# Patient Record
Sex: Female | Born: 1965 | Race: Black or African American | Hispanic: No | Marital: Married | Smoking: Never smoker
Health system: Southern US, Community
[De-identification: ages and names within clinical notes are randomized; demographics above are authoritative.]

## PROBLEM LIST (undated history)

## (undated) DIAGNOSIS — H409 Unspecified glaucoma: Secondary | ICD-10-CM

## (undated) DIAGNOSIS — F419 Anxiety disorder, unspecified: Secondary | ICD-10-CM

## (undated) DIAGNOSIS — R569 Unspecified convulsions: Secondary | ICD-10-CM

## (undated) DIAGNOSIS — F32A Depression, unspecified: Secondary | ICD-10-CM

## (undated) DIAGNOSIS — K219 Gastro-esophageal reflux disease without esophagitis: Secondary | ICD-10-CM

## (undated) DIAGNOSIS — F329 Major depressive disorder, single episode, unspecified: Secondary | ICD-10-CM

## (undated) DIAGNOSIS — M1990 Unspecified osteoarthritis, unspecified site: Secondary | ICD-10-CM

## (undated) DIAGNOSIS — Z1509 Genetic susceptibility to other malignant neoplasm: Secondary | ICD-10-CM

## (undated) HISTORY — PX: WISDOM TOOTH EXTRACTION: SHX21

## (undated) HISTORY — PX: JOINT REPLACEMENT: SHX530

## (undated) HISTORY — PX: ABDOMINAL HYSTERECTOMY: SHX81

## (undated) HISTORY — PX: TUBAL LIGATION: SHX77

## (undated) HISTORY — PX: OTHER SURGICAL HISTORY: SHX169

## (undated) HISTORY — DX: Anxiety disorder, unspecified: F41.9

## (undated) HISTORY — DX: Unspecified glaucoma: H40.9

## (undated) HISTORY — PX: CO2 LASER APPLICATION: SHX5778

---

## 1997-08-19 ENCOUNTER — Inpatient Hospital Stay (HOSPITAL_COMMUNITY): Admission: AD | Admit: 1997-08-19 | Discharge: 1997-08-19 | Payer: Self-pay | Admitting: Obstetrics

## 1997-08-27 ENCOUNTER — Inpatient Hospital Stay (HOSPITAL_COMMUNITY): Admission: AD | Admit: 1997-08-27 | Discharge: 1997-08-27 | Payer: Self-pay | Admitting: *Deleted

## 1997-10-16 ENCOUNTER — Other Ambulatory Visit: Admission: RE | Admit: 1997-10-16 | Discharge: 1997-10-16 | Payer: Self-pay | Admitting: *Deleted

## 1997-10-30 ENCOUNTER — Ambulatory Visit (HOSPITAL_COMMUNITY): Admission: RE | Admit: 1997-10-30 | Discharge: 1997-10-30 | Payer: Self-pay | Admitting: Obstetrics & Gynecology

## 1997-12-24 ENCOUNTER — Inpatient Hospital Stay (HOSPITAL_COMMUNITY): Admission: AD | Admit: 1997-12-24 | Discharge: 1997-12-26 | Payer: Self-pay | Admitting: *Deleted

## 1998-09-14 ENCOUNTER — Encounter: Payer: Self-pay | Admitting: Emergency Medicine

## 1998-09-14 ENCOUNTER — Inpatient Hospital Stay (HOSPITAL_COMMUNITY): Admission: EM | Admit: 1998-09-14 | Discharge: 1998-09-16 | Payer: Self-pay | Admitting: Emergency Medicine

## 1999-04-28 ENCOUNTER — Encounter: Admission: RE | Admit: 1999-04-28 | Discharge: 1999-05-25 | Payer: Self-pay

## 1999-07-06 ENCOUNTER — Emergency Department (HOSPITAL_COMMUNITY): Admission: EM | Admit: 1999-07-06 | Discharge: 1999-07-06 | Payer: Self-pay | Admitting: Emergency Medicine

## 1999-07-10 ENCOUNTER — Emergency Department (HOSPITAL_COMMUNITY): Admission: EM | Admit: 1999-07-10 | Discharge: 1999-07-10 | Payer: Self-pay | Admitting: Emergency Medicine

## 1999-07-10 ENCOUNTER — Encounter: Payer: Self-pay | Admitting: Emergency Medicine

## 1999-07-20 HISTORY — PX: ANKLE SURGERY: SHX546

## 1999-11-19 ENCOUNTER — Inpatient Hospital Stay (HOSPITAL_COMMUNITY): Admission: EM | Admit: 1999-11-19 | Discharge: 1999-11-19 | Payer: Self-pay | Admitting: Obstetrics & Gynecology

## 1999-12-08 ENCOUNTER — Encounter: Admission: RE | Admit: 1999-12-08 | Discharge: 1999-12-08 | Payer: Self-pay | Admitting: Sports Medicine

## 2000-01-14 ENCOUNTER — Other Ambulatory Visit: Admission: RE | Admit: 2000-01-14 | Discharge: 2000-01-14 | Payer: Self-pay | Admitting: Obstetrics and Gynecology

## 2000-04-28 ENCOUNTER — Inpatient Hospital Stay (HOSPITAL_COMMUNITY): Admission: AD | Admit: 2000-04-28 | Discharge: 2000-04-28 | Payer: Self-pay | Admitting: *Deleted

## 2000-07-13 ENCOUNTER — Inpatient Hospital Stay (HOSPITAL_COMMUNITY): Admission: AD | Admit: 2000-07-13 | Discharge: 2000-07-15 | Payer: Self-pay | Admitting: Obstetrics and Gynecology

## 2000-08-30 ENCOUNTER — Ambulatory Visit (HOSPITAL_COMMUNITY): Admission: RE | Admit: 2000-08-30 | Discharge: 2000-08-30 | Payer: Self-pay | Admitting: Obstetrics and Gynecology

## 2002-07-09 ENCOUNTER — Ambulatory Visit (HOSPITAL_COMMUNITY): Admission: RE | Admit: 2002-07-09 | Discharge: 2002-07-09 | Payer: Self-pay | Admitting: *Deleted

## 2002-09-25 ENCOUNTER — Emergency Department (HOSPITAL_COMMUNITY): Admission: EM | Admit: 2002-09-25 | Discharge: 2002-09-25 | Payer: Self-pay | Admitting: Emergency Medicine

## 2002-12-31 ENCOUNTER — Inpatient Hospital Stay (HOSPITAL_COMMUNITY): Admission: EM | Admit: 2002-12-31 | Discharge: 2003-01-08 | Payer: Self-pay | Admitting: Psychiatry

## 2003-01-27 ENCOUNTER — Emergency Department (HOSPITAL_COMMUNITY): Admission: EM | Admit: 2003-01-27 | Discharge: 2003-01-27 | Payer: Self-pay | Admitting: Emergency Medicine

## 2003-02-07 ENCOUNTER — Encounter: Admission: RE | Admit: 2003-02-07 | Discharge: 2003-02-07 | Payer: Self-pay | Admitting: Internal Medicine

## 2003-03-29 ENCOUNTER — Inpatient Hospital Stay (HOSPITAL_COMMUNITY): Admission: EM | Admit: 2003-03-29 | Discharge: 2003-03-31 | Payer: Self-pay | Admitting: Psychiatry

## 2004-07-01 ENCOUNTER — Ambulatory Visit (HOSPITAL_COMMUNITY): Admission: RE | Admit: 2004-07-01 | Discharge: 2004-07-01 | Payer: Self-pay | Admitting: *Deleted

## 2004-07-01 ENCOUNTER — Encounter (INDEPENDENT_AMBULATORY_CARE_PROVIDER_SITE_OTHER): Payer: Self-pay | Admitting: Specialist

## 2004-10-13 ENCOUNTER — Emergency Department (HOSPITAL_COMMUNITY): Admission: EM | Admit: 2004-10-13 | Discharge: 2004-10-13 | Payer: Self-pay | Admitting: Family Medicine

## 2005-08-16 ENCOUNTER — Other Ambulatory Visit: Admission: RE | Admit: 2005-08-16 | Discharge: 2005-08-16 | Payer: Self-pay | Admitting: Obstetrics and Gynecology

## 2005-09-01 ENCOUNTER — Ambulatory Visit: Payer: Self-pay | Admitting: Internal Medicine

## 2006-01-08 ENCOUNTER — Emergency Department (HOSPITAL_COMMUNITY): Admission: EM | Admit: 2006-01-08 | Discharge: 2006-01-08 | Payer: Self-pay | Admitting: Emergency Medicine

## 2006-06-02 ENCOUNTER — Emergency Department (HOSPITAL_COMMUNITY): Admission: EM | Admit: 2006-06-02 | Discharge: 2006-06-02 | Payer: Self-pay | Admitting: Family Medicine

## 2006-12-14 ENCOUNTER — Emergency Department (HOSPITAL_COMMUNITY): Admission: EM | Admit: 2006-12-14 | Discharge: 2006-12-14 | Payer: Self-pay | Admitting: Emergency Medicine

## 2006-12-16 ENCOUNTER — Emergency Department (HOSPITAL_COMMUNITY): Admission: EM | Admit: 2006-12-16 | Discharge: 2006-12-16 | Payer: Self-pay | Admitting: Family Medicine

## 2007-01-14 ENCOUNTER — Emergency Department (HOSPITAL_COMMUNITY): Admission: EM | Admit: 2007-01-14 | Discharge: 2007-01-14 | Payer: Self-pay | Admitting: Family Medicine

## 2007-03-17 ENCOUNTER — Ambulatory Visit: Payer: Self-pay | Admitting: Psychiatry

## 2007-03-17 ENCOUNTER — Other Ambulatory Visit: Payer: Self-pay | Admitting: *Deleted

## 2007-03-17 ENCOUNTER — Other Ambulatory Visit: Payer: Self-pay | Admitting: Emergency Medicine

## 2007-03-17 ENCOUNTER — Inpatient Hospital Stay (HOSPITAL_COMMUNITY): Admission: AD | Admit: 2007-03-17 | Discharge: 2007-03-20 | Payer: Self-pay | Admitting: Psychiatry

## 2007-05-03 ENCOUNTER — Emergency Department (HOSPITAL_COMMUNITY): Admission: EM | Admit: 2007-05-03 | Discharge: 2007-05-03 | Payer: Self-pay | Admitting: Family Medicine

## 2007-05-04 ENCOUNTER — Emergency Department (HOSPITAL_COMMUNITY): Admission: EM | Admit: 2007-05-04 | Discharge: 2007-05-04 | Payer: Self-pay | Admitting: Emergency Medicine

## 2007-08-11 ENCOUNTER — Emergency Department (HOSPITAL_COMMUNITY): Admission: EM | Admit: 2007-08-11 | Discharge: 2007-08-12 | Payer: Self-pay | Admitting: Emergency Medicine

## 2007-11-29 ENCOUNTER — Emergency Department (HOSPITAL_COMMUNITY): Admission: EM | Admit: 2007-11-29 | Discharge: 2007-11-29 | Payer: Self-pay | Admitting: Emergency Medicine

## 2008-02-13 ENCOUNTER — Encounter: Admission: RE | Admit: 2008-02-13 | Discharge: 2008-02-13 | Payer: Self-pay | Admitting: Obstetrics and Gynecology

## 2008-03-27 ENCOUNTER — Inpatient Hospital Stay (HOSPITAL_COMMUNITY): Admission: EM | Admit: 2008-03-27 | Discharge: 2008-04-01 | Payer: Self-pay | Admitting: Emergency Medicine

## 2008-09-07 ENCOUNTER — Inpatient Hospital Stay (HOSPITAL_COMMUNITY): Admission: EM | Admit: 2008-09-07 | Discharge: 2008-09-11 | Payer: Self-pay | Admitting: Emergency Medicine

## 2009-12-12 ENCOUNTER — Encounter: Admission: RE | Admit: 2009-12-12 | Discharge: 2009-12-12 | Payer: Self-pay | Admitting: Internal Medicine

## 2010-08-09 ENCOUNTER — Encounter: Payer: Self-pay | Admitting: Obstetrics and Gynecology

## 2010-11-03 LAB — CBC
HCT: 36.4 % (ref 36.0–46.0)
HCT: 42.9 % (ref 36.0–46.0)
Hemoglobin: 12.2 g/dL (ref 12.0–15.0)
Hemoglobin: 12.5 g/dL (ref 12.0–15.0)
MCHC: 32.7 g/dL (ref 30.0–36.0)
MCHC: 34 g/dL (ref 30.0–36.0)
MCV: 92.5 fL (ref 78.0–100.0)
MCV: 93.1 fL (ref 78.0–100.0)
Platelets: 122 10*3/uL — ABNORMAL LOW (ref 150–400)
Platelets: 134 10*3/uL — ABNORMAL LOW (ref 150–400)
RBC: 3.51 MIL/uL — ABNORMAL LOW (ref 3.87–5.11)
RBC: 3.9 MIL/uL (ref 3.87–5.11)
RDW: 14.8 % (ref 11.5–15.5)
RDW: 16 % — ABNORMAL HIGH (ref 11.5–15.5)
WBC: 3.8 10*3/uL — ABNORMAL LOW (ref 4.0–10.5)
WBC: 4.5 10*3/uL (ref 4.0–10.5)

## 2010-11-03 LAB — URINE MICROSCOPIC-ADD ON

## 2010-11-03 LAB — COMPREHENSIVE METABOLIC PANEL
ALT: 138 U/L — ABNORMAL HIGH (ref 0–35)
ALT: 220 U/L — ABNORMAL HIGH (ref 0–35)
AST: 1897 U/L — ABNORMAL HIGH (ref 0–37)
AST: 307 U/L — ABNORMAL HIGH (ref 0–37)
Alkaline Phosphatase: 272 U/L — ABNORMAL HIGH (ref 39–117)
Alkaline Phosphatase: 282 U/L — ABNORMAL HIGH (ref 39–117)
BUN: <1 mg/dL — ABNORMAL LOW (ref 6–23)
CO2: 26 mEq/L (ref 19–32)
CO2: 29 mEq/L (ref 19–32)
Chloride: 101 mEq/L (ref 96–112)
Chloride: 95 mEq/L — ABNORMAL LOW (ref 96–112)
Chloride: 99 mEq/L (ref 96–112)
Creatinine, Ser: 0.67 mg/dL (ref 0.4–1.2)
Creatinine, Ser: 0.94 mg/dL (ref 0.4–1.2)
GFR calc Af Amer: >60 mL/min (ref >60)
GFR calc Af Amer: >60 mL/min (ref >60)
GFR calc non Af Amer: >60 mL/min (ref >60)
GFR calc non Af Amer: >60 mL/min (ref >60)
Glucose, Bld: 91 mg/dL (ref 70–99)
Potassium: 3.1 mEq/L — ABNORMAL LOW (ref 3.5–5.1)
Potassium: 4.3 mEq/L (ref 3.5–5.1)
Total Bilirubin: 5.1 mg/dL — ABNORMAL HIGH (ref 0.3–1.2)
Total Bilirubin: 6.1 mg/dL — ABNORMAL HIGH (ref 0.3–1.2)
Total Bilirubin: 7.8 mg/dL — ABNORMAL HIGH (ref 0.3–1.2)

## 2010-11-03 LAB — APTT: aPTT: 29 seconds (ref 24–37)

## 2010-11-03 LAB — URINALYSIS, ROUTINE W REFLEX MICROSCOPIC
Glucose, UA: 100 mg/dL — AB
Ketones, ur: 15 mg/dL — AB
Protein, ur: 100 mg/dL — AB

## 2010-11-03 LAB — DIFFERENTIAL
Band Neutrophils: 0 % (ref 0–10)
Blasts: 0 %
Lymphocytes Relative: 31 % (ref 12–46)
Metamyelocytes Relative: 0 %
Myelocytes: 0 %
Promyelocytes Absolute: 0 %

## 2010-11-03 LAB — HEPATITIS C ANTIBODY: HCV Ab: NEGATIVE

## 2010-11-03 LAB — RAPID URINE DRUG SCREEN, HOSP PERFORMED
Barbiturates: NOT DETECTED
Benzodiazepines: POSITIVE — AB
Cocaine: NOT DETECTED

## 2010-11-03 LAB — HEPATIC FUNCTION PANEL
AST: 296 U/L — ABNORMAL HIGH (ref 0–37)
Albumin: 2.6 g/dL — ABNORMAL LOW (ref 3.5–5.2)
Total Protein: 6.3 g/dL (ref 6.0–8.3)

## 2010-11-03 LAB — PROTIME-INR: INR: 1 (ref 0.00–1.49)

## 2010-12-01 NOTE — Consult Note (Signed)
NAMEJAMIL, CASTILLO NO.:  [id number]   MEDICAL RECORD NO.:  [id number]          PATIENT TYPE:  INP   LOCATION:  3015                         FACILITY:  [facility]   PHYSICIAN:  Bernette Redbird, M.D.   DATE OF BIRTH:  08/08/1965   DATE OF CONSULTATION:  03/27/2008  DATE OF DISCHARGE:                                 CONSULTATION   ADDENDUM   Family history of this patient discloses that her brother developed  colon cancer around age 45, requiring chemotherapy.   The patient herself will need screening for colon cancer once she is  over this acute illness.           ______________________________  Bernette Redbird, M.D.     RB/MEDQ  D:  03/27/2008  T:  03/28/2008  Job:  [id number]   cc:   Fleet Contras, M.D.

## 2010-12-01 NOTE — Discharge Summary (Signed)
[name], [name]:  [id number]   MEDICAL RECORD NO.:  [id number]          PATIENT TYPE:  IPS   LOCATION:  0507                          FACILITY:  [facility]   PHYSICIAN:  Anselm Jungling, MD  DATE OF BIRTH:  09-Feb-1966   DATE OF ADMISSION:  03/17/2007  DATE OF DISCHARGE:  03/20/2007                               DISCHARGE SUMMARY   IDENTIFYING DATA/REASON FOR ADMISSION:  The patient is a 45 year old  widowed white female who presented requesting help with alcohol  detoxification and depression.  She had lost her husband last October.  She had been drinking a case of beer per day.  Please refer to the  admission note for further details pertaining to the symptoms,  circumstances and history that led to her hospitalization.   INITIAL DIAGNOSTIC IMPRESSION:  She was given initial AXIS I diagnoses  of alcohol dependence, depressive disorder not otherwise specified,  substance-induced mood disorder, and bereavement.   MEDICAL/LABORATORY:  The patient was in good health without any active  or chronic medical problems.  She was given Protonix 40 mg daily for  symptoms of GERD.   HOSPITAL COURSE:  The patient was admitted to the adult inpatient  psychiatric service.  She presented as a well-nourished, well-developed  woman who was pleasant and cooperative.  There were no signs or symptoms  of psychosis or thought disorder.  She denied suicidal ideation, but was  quite depressed.  She verbalized a strong desire for help.   She was involved in the therapeutic milieu including 12-step groups.  She was ordered Librium on a p.r.n. basis, but had no withdrawal  symptoms and really did not require any.  In review of her depressive  symptoms, she did appear to be a candidate for antidepressant medication  therapy and was started on a trial of Lexapro, which was well-tolerated.  On the fourth hospital day, she appeared to be beyond the point of  having any further  possibility of alcohol withdrawal symptoms.  She  agreed to the following aftercare plan.   AFTERCARE:  The patient was to present for a walk-in assessment at the  [facility] in [place] on the day following discharge.   DISCHARGE MEDICATIONS:  1. Zoloft 50 mg daily, which was initiated instead of Lexapro as she      had had a good family response to Zoloft for depression in the      past.  2. Campral 666 mg t.i.d. for alcohol cessation.  3. Protonix 40 mg daily.   DISCHARGE DIAGNOSES:  AXIS I:  Alcohol dependence, early remission.  Substance-induced mood disorder.  Depressive disorder not otherwise  specified.  Bereavement.  AXIS II:  Deferred.  AXIS III:  No acute or chronic illnesses.  AXIS IV:  Stressors:  Severe.  AXIS V:  GAF on discharge 55.      Anselm Jungling, MD  Electronically Signed     SPB/MEDQ  D:  03/22/2007  T:  03/22/2007  Job:  [id number]

## 2010-12-01 NOTE — H&P (Signed)
[name], [name]                 ACCOUNT NO.:  [id number]   MEDICAL RECORD NO.:  [id number]          PATIENT TYPE:  INP   LOCATION:  3015                         FACILITY:  [facility]   PHYSICIAN:  Fleet Contras, M.D.    DATE OF BIRTH:  1966-04-12   DATE OF ADMISSION:  03/27/2008  DATE OF DISCHARGE:                              HISTORY & PHYSICAL   PRESENTING COMPLAINTS:  Yellowing of eyes, weakness, and weight loss.   HISTORY OF PRESENT ILLNESS:  [name] is a 45 year old African American  lady with past medical problems significant for depression and  gastroesophageal reflux disease.  She presented to the emergency room at  [facility] with 63-month history of progressive symptoms of  weight loss in spite of good appetite and then in last few weeks, she  has yellowing of the eyes and her palms.  She stated that her husband  died about 8 months ago and she has been having some binge drinking and  has been using some Tylenol and ibuprofen for abdominal pain on and off.  She stated that her stool color varies from brown to green and pale.  She denies any nausea, vomiting, or diarrhea, but has been constipated  on occasion.  She has not had any recent blood transfusions or tattoos.  She admits to drinking several beers on a daily basis and consumes a  whole crate of beer on a weekend.  She denies using any tobacco or  illicit drugs.  She has not had any fevers or chills.  She states her  appetite is relatively good, but on occasion when she is very depressed,  she does not eat much.  She has had no headaches, blurring of vision,  weakness of extremities, or slurring of speech.  She had no chest pain,  shortness of breath, orthopnea, PND, or palpitations.  She denies any  urinary symptoms.  She had no joint pains or joint swellings.  At  emergency room, initial laboratory data showed a markedly elevated liver  function tests and ultrasound scan and CT scan confirmed enlarged  gallbladder without stones, but no evidence of acute cholecystitis.  She  has not been evaluated by surgery and determined not to be a surgical  candidate and she is being admitted under medical service for further  evaluation and appropriate management.   PAST MEDICAL HISTORY:  1. Depression.  She has been treated on and off over the last few      years.  She was last treated in 2007, with Lexapro and alprazolam.  2. Gastroesophageal reflux disease.  She was on Zantac 150 mg b.i.d.   ALLERGIES:  She denies any drug allergies.   FAMILY AND SOCIAL HISTORY:  She is widowed and currently lives with her  children 101 and 55 year old.  Alcohol use as above.  She denies any use  of tobacco or illicit drugs.   REVIEW OF SYSTEMS:  Essentially as above.   PHYSICAL EXAMINATION:  GENERAL:  She is lying in the hospital bed, not  in acute respiratory or painful distress.  She is not  pale.  She is  moderately icteric.  She is not cyanosed.  She is slightly dehydrated.  INITIAL VITAL SIGNS:  Blood pressure of 158/98; heart rate of 115,  regular; respiratory rate of 16; temperature 98.8; and O2 sats on room  air is 98%.  NECK:  Supple with no elevated JVD.  No cervical lymphadenopathy.  No  carotid bruit.  CHEST:  Good air entry bilaterally.  No rales, rhonchi, or wheezes.  HEART:  S1 and S2 are heard with no murmurs, no S3 gallops.  ABDOMEN:  Full, soft, nontender, and no masses.  Bowel sounds are  present.  EXTREMITIES:  Shows no edema.  No calf tenderness or swelling.  The skin  shows no rashes or evidence of chronic liver disease.  CNS: She is alert and oriented x3 with no focal neurological deficits.   INITIAL LABORATORY DATA:  Sodium is 133, potassium 3.6, chloride 99,  bicarbonate of 26, glucose is 112, BUN is 2, creatinine 0.73, total  bilirubin is 4.2, alkaline phosphatase 348, AST 1046, ALT 245, total  bilirubin 4.2, total protein 7.1, albumin 3.4, calcium was 9.5, lipase  is 28,  pregnancy test by urine is negative.  CBC shows white count of  4.7, hemoglobin 12.3, hematocrit 37.6, and platelet count of 135.  Urinalysis essentially negative urine.  Microscopy shows 3-6 wbc by high-  power field and rare bacteria.  Amylase is 33.  Tylenol levels is less  than 10.  PT is 12.7, INR was 0.9, and APTT is 25.  Chest x-ray is  reported as showing no acute chest disease.  Ultrasound scan of the  abdomen shows increased echo texture throughout the liver compatible  with fatty infiltration, as gallbladder had distention, sludge, and wall  thickening, no definite stones.  A CT scan of the abdomen and pelvis  essentially confirmed the findings on ultrasound scan.   ASSESSMENT:  [name] is a 45 year old African American lady presented  to the emergency room with about 20 pounds of weight loss over the last  3 months associated with progressive worsening of yellow in eyes and  hands.  She admits to using alcohol and Tylenol in a excessive amounts  recently.  She is being admitted to the hospital for further evaluation.   ADMISSION DIAGNOSES:  1. Obstructive jaundice.  2. Distended gallbladder.  She has been evaluated by surgery and      consider not to be a surgical candidate.  A HIDA scan is being      requested.  3. Chronic alcohol abuse.   PLAN OF CARE:  She will be admitted to a medical bed.  She will be on  clear liquids.  Vital signs q.4 h. activity as tolerated.  She will be  on IV fluids with D5 normal saline at 75 mL an hour, multivitamins,  folic acid, and thiamine will be commenced.  She did have oxycodone 5 mg  1-2 pills q.6 h. p.r.n. for pain.   Further laboratory data including ANA, sed rate, 2 more markers, ammonia  levels, and acute hepatitis panel will be requested.  A gastroenterology  consult will also be requested.  This plan of care has been discussed  with the patient and the treatment plan will depend on her response to  appropriate therapy and  results of further investigation.      Fleet Contras, M.D.  Electronically Signed     EA/MEDQ  D:  03/27/2008  T:  03/28/2008  Job:  [id number]

## 2010-12-01 NOTE — Op Note (Signed)
NAMEMALLISA, ALAMEDA NO.:  [id number]   MEDICAL RECORD NO.:  [id number]          PATIENT TYPE:  INP   LOCATION:  3015                         FACILITY:  [facility]   PHYSICIAN:  Danise Edge, M.D.   DATE OF BIRTH:  12/30/1965   DATE OF PROCEDURE:  04/01/2008  DATE OF DISCHARGE:  04/01/2008                               OPERATIVE REPORT   REFERRING PHYSICIAN:  Bernette Redbird, MD   PROCEDURE INDICATIONS:  Ms. Tiaria Biby is a 45 year old female born  1966-02-03.  Ms. Taplin's brother died of colon cancer in his  late 22s.  I think Ms. Spielmann last underwent a screening colonoscopy by  Dr. Roosvelt Harps in 2005.  I do not have the operative report.   ENDOSCOPY:  Danise Edge, MD   PREMEDICATIONS:  Versed 12.5 mg and fentanyl 135 mcg.   PROCEDURE:  After obtaining informed consent, Ms. Vinsant was placed in  the left lateral decubitus position.  I administered intravenous  fentanyl and intravenous Versed to achieve conscious sedation for the  procedure.  The patient's blood pressure, oxygen saturation and cardiac  rhythm were monitored throughout the procedure and documented in the  medical record.   Anal inspection and digital rectal exam were normal.  The Pentax  pediatric colonoscope was introduced into the rectum and advanced to the  cecum.  A normal-appearing ileocecal valve and appendiceal orifice were  identified.  Colonic preparation for the exam today was good.   Rectum normal.  Sigmoid colon and descending colon normal.  Splenic flexure normal.  Transverse colon normal.  Hepatic flexure normal.  Ascending colon normal.  Cecum and ileocecal valve normal.   ASSESSMENT:  Normal proctocolonoscopy to the cecum.  No endoscopic  evidence for the presence of colorectal neoplasia.   RECOMMENDATIONS:  Repeat screening colonoscopy in approximately 5 years.          ______________________________  Danise Edge, M.D.    MJ/MEDQ  D:  04/01/2008   T:  04/02/2008  Job:  [id number]   cc:   Bernette Redbird, M.D.
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
## 2010-12-01 NOTE — Consult Note (Signed)
[name], [name] NO.:  [id number]   MEDICAL RECORD NO.:  [id number]          PATIENT TYPE:  INP   LOCATION:  3015                         FACILITY:  [facility]   PHYSICIAN:  Bernette Redbird, M.D.   DATE OF BIRTH:  01-04-1966   DATE OF CONSULTATION:  03/27/2008  DATE OF DISCHARGE:                                 CONSULTATION   We are asked to see [name] today in consultation for obstructive  jaundice by Dr. Basilio Cairo.   HISTORY OF PRESENT ILLNESS:  This is an 45 year old female with a  history of alcohol abuse who reports abdominal pain, increasing over the  last 3 weeks.  She also reports anorexia with a 15-pound weight loss,  confusion, disorientation, multiple falls, and several episodes of  vomiting fluid which she describes as bilious.  She also reports long-  term dysphagia to solids and with occasional regurgitation, she also has  a sensation of globus.  She says, she has had these for years as a  matter of fact, she had an upper endoscopy by Dr. Roosvelt Harps for  this in 2005, which was normal.  She tells me that her scleral icterus  started approximately 3 days ago.  She did have heavy alcohol use over  the holiday weekend; although, she cannot quantify it for me.  She tells  me, she takes approximately 6 Tylenol a day and multiple ibuprofen  daily, all this for her abdominal and extremities pain.   PAST MEDICAL HISTORY:  Significant for depression.  She has had a  Behavioral Health admission.  She has seizure disorder.  She has a  history of alcohol abuse.  She has had a bilateral tubal ligation.   CURRENT MEDICATIONS:  Include, approximately 6 Tylenol daily, multiple  ibuprofen daily, samples of antidepressants that were given to her by a  doctor and an over-the-counter sleeping aid.   She has no known drug allergies.   REVIEW OF SYSTEMS:  Significant for recent fever, anorexia and weight  loss.   SOCIAL HISTORY:  Positive for alcohol.  She  cannot quantify the amount.  She denies tobacco use.   PHYSICAL EXAMINATION:  GENERAL:  She is alert and appropriate.  She  appears somewhat jittery.  VITAL SIGNS:  Her temperature is 97, pulse 78, respirations 20, and  blood pressure is 146/74.  HEENT:  She does have 2+ scleral icterus.  HEART:  Regular rate and rhythm.  LUNGS:  Clear to auscultation.  ABDOMEN:  Soft with no obvious ascites, mildly tender in the epigastrium  and the left upper quadrant.  She has no organomegaly noted.   LABORATORY DATA:  Significant for her LFTs which show an AST of 1046,  ALT 245, alk phos 348, and total bilirubin 4.2.  Her BMET is within  normal limits, other than a sodium of 133.  Her CBC is within normal  limits.  Amylase and lipase are within normal limits.  Coreg,  acetaminophen level, and ammonia level are all pending.  A CT of her  abdomen done today showed:  1. Fatty liver.  2. Mild hepatomegaly  3. Gallbladder wall thickening and distention of gallbladder.  4. Normal pancreas.  5. No bowel obstruction.  She also had an ultrasound of her abdomen done that showed basically the  same thing.   ASSESSMENT:  Dr. Molly Maduro Buccini has seen and examined the patient,  collected her history and reviewed her chart.  His impression is, this  is an unfortunate 45 year old female with heavy alcohol use as well as  heavy Tylenol use.  Her LFTs give the picture of both obstructive  jaundice as well as an alcoholic hepatitis-like pattern.  The patient  also describes some dysphagia versus globus, could be attributed to  NSAID gastritis.   PLAN:  Will check serologies.  Recommend a [facility] consult, if not already  done.  We will also check a hepatitis panel and consider an upper  endoscopy if her dysphagia symptoms continue.  We will follow with you.   Thank you very much for this consultation.      Stephani Police, PA    ______________________________  Bernette Redbird, M.D.    MLY/MEDQ  D:   03/27/2008  T:  03/28/2008  Job:  [id number]   cc:   Fleet Contras, MD  Bernette Redbird, M.D.

## 2010-12-01 NOTE — Discharge Summary (Signed)
NAMEMAYRIN, SCHMUCK NO.:  [id number]   MEDICAL RECORD NO.:  [id number]          PATIENT TYPE:  INP   LOCATION:  3036                         FACILITY:  [facility]   PHYSICIAN:  Fleet Contras, M.D.    DATE OF BIRTH:  03-30-1966   DATE OF ADMISSION:  09/07/2008  DATE OF DISCHARGE:  09/11/2008                               DISCHARGE SUMMARY   HISTORY OF PRESENT ILLNESS:  This is an another admission for this 45-  year-old African American lady with past medical history significant for  chronic alcohol abuse and anxiety disorder.  She came to the emergency  room at [facility] with few days history of abdominal pain  which was in the epigastrium, nonradiating, and she rated it as about  4/10 at its worst.  She admitted to having drank some alcohol several  days before onset of symptoms.  She had some nausea and vomiting but no  diarrhea.  She did not have any chest pain, shortness of breath,  orthopnea, or PND.  At the emergency room, vital signs were stable.  She  was in mild painful distress.  Abdomen was tender in the epigastric  region.  She was noted to be icteric.  Admission blood pressure was  137/92, heart rate of 111, respiratory rate of 24, temperature of 97.8,  and O2 sat on room air was 99%.  Initial laboratory data showed elevated  liver function tests.  AST of 1197, ALT was 304, alkaline phosphatase  354, total bilirubin 7.8, albumin was 3.5, calcium 10.2, potassium was  2.9, BUN was 5, and creatinine 0.9.  X-ray of the abdomen showed  suspicion of pneumatosis intestinalis involving the right colon.  CT  scan also confirmed this finding.  There was no evidence of any  ischemia.  There was no small bowel obstruction.  This was thought to  represent mild ileus due to the hypokalemia.   HOSPITAL COURSE:  On admission, the patient was placed on n.p.o.  Potassium was repleted intravenously as she was placed on IV fluids.  She was started on delirium  tremens protocol with chlordiazepoxide or  Librium which she tolerated.  The fluids input and output were monitored  closely.  Her hepatitis C screen was negative and hepatitis B e-antigen  was also negative.  She continued to make steady progress and was able  to start clear liquid diet which was advanced to regular diet.  She was  advised to seek inpatient alcohol rehab but she refused and stated that  she will return to her outpatient rehab as well as counseling.  Today,  she is feeling much better.  She is tolerating a full diet.  She is not  in any acute respiratory or painful distress.   PHYSICAL EXAMINATION:  VITAL SIGNS:  Stable with a blood pressure of  117/74, heart rate of 106 and regular, respiratory rate of 16,  temperature 97.7, and O2 saturation on room air is 98%.  GENERAL:  She is too mildly icteric.  She is not pale.  She is not  cyanosed.  She  is well-hydrated.  CHEST:  Clear to auscultation.  ABDOMEN:  Benign.  EXTREMITIES:  No edema or calf tenderness.  CNS:  She is alert and oriented x3 with no focal neurological deficits.   LABORATORY DATA:  Today white count is 6.0, hemoglobin 12.2, hematocrit  37.3, and platelet count of 134.  Liver function tests showed AST 296,  ALT 135, alkaline phosphatase 247, total bilirubin of 4.2, and albumin  of 2.6.  She is now considered stable for discharge to home.   DISCHARGE DIAGNOSES:  1. Acute alcoholic hepatitis.  2. Acute alcoholic gastritis.  3. Chronic alcoholism.  4. Hypokalemia.  5. Thrombocytopenia.   CONDITION ON DISCHARGE:  Stable.   DISPOSITION:  To home.   DISCHARGE MEDICATIONS:  1. Protonix 40 mg daily.  2. Lorazepam 0.5 mg b.i.d. p.r.n.  3. Thiamine 100 mg daily.  4. Ambien 10 mg at bedtime p.r.n.  5. Folic acid 1 mg daily.  6. Claritin 10 mg p.o. daily.   FOLLOWUP:  She is to followup with me in the office in 1 week for repeat  liver function tests.  This discharge plan has been discussed with her   and questions answered.      Fleet Contras, M.D.  Electronically Signed     EA/MEDQ  D:  09/11/2008  T:  09/11/2008  Job:  [id number]
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
## 2010-12-04 NOTE — H&P (Signed)
[facility]  Patient:    [name], [name]                          MRN: [id number] Attending:  Maris Berger. Pennie Rushing, M.D. Dictator:   Henreitta Leber, P.A.                         History and Physical  DATE OF BIRTH:                Oct 19, 1965  HISTORY OF PRESENT ILLNESS:   This is a 45 year old single African-American female who is gravida 4, para 4-0-0-3, who delivered by spontaneous vaginal delivery a viable female infant Jillyn Hidden), July 14, 2000.  The patient presents for laparoscopic tubal cautery.  PAST MEDICAL HISTORY:         OB; gravida 4, para 4-0-0-3.  The patient lost one infant two weeks postpartum secondary to SIDS; the patient also has a history of postpartum hemorrhage.  GYN; Menarche 45 years old.  The patient has a history of irregular menstrual periods prior to her last pregnancy in 2001.  She used Depo-Provera and spermacidal preparations for contraception. The patient has a remote history of Chlamydia and abnormal Pap smears, however, her most recent Pap smears have been normal with June 2001 being her most recent.  Medical; positive for peptic ulcer disease, multiple fractures of her right lower extremity secondary to a fall which has caused the patient to be disabled.  PAST SURGICAL HISTORY:        Multiple orthopedic surgeries of her right lower extremity secondary to a fall accompanied by a skin graft of the same.  FAMILY HISTORY:               Positive for hypertension, end-stage renal disease, systemic lupus erythematosus, and colon cancer.  SOCIAL HISTORY:               The patient is single and unemployed (disabled).  MEDICATIONS:                  None.  ALLERGIES:                    No known drug allergies.  REVIEW OF SYSTEMS:            Positive for constipation and corrective lenses. Otherwise negative.  PHYSICAL EXAMINATION:  VITAL SIGNS:                  Blood pressure 110/70, weight 135, height 5  feet 4 inches tall.  HEENT:                        Normal.  Thyroid not enlarged.  HEART:                        Regular rate and rhythm.  LUNGS:                        Clear.  ABDOMEN:                      Nontender without masses or organomegaly.  The patient has a well-healed right paramedian incision.  EXTREMITIES:                  Without clubbing, cyanosis, or edema.  NEUROLOGICAL:  Within normal limits as tested.  PELVIC:                       EGBUS is within normal limits.  Vagina is normal.  Cervix is nontender without lesions.  Uterus normal size, shape, and consistency, nontender.  Adnexa no masses or tenderness.  Rectovaginal without masses.  IMPRESSION:                   Desire for sterilization.  PLAN:                         A discussion was held with the patient regarding the risks and benefits of her operative procedure and she has consented to undergo a laparoscopic tubal cautery on August 30, 2000, at [facility] [place]. DD:  08/25/00 TD:  08/25/00 Job: [id number]

## 2010-12-04 NOTE — Op Note (Signed)
[facility]  Patient:    [name], [name]                        MRN: [id number] Proc. Date: 08/30/00 Adm. Date:  [id number] Attending:  Dierdre Forth Pearline                           Operative Report  PREOPERATIVE DIAGNOSIS:       Desire for surgical sterilization.  POSTOPERATIVE DIAGNOSIS:      Desire for surgical sterilization.  OPERATION:                    Laparoscopic tubal cautery.  SURGEON:                      Vanessa P. Pennie Rushing, M.D.  ANESTHESIA:                   General orotracheal.  ESTIMATED BLOOD LOSS:         Less than 10 cc.  COMPLICATIONS:                None.  FINDINGS:                     The uterus, tubes, and left ovary were all within normal limits.   The right ovary contained stigmata of a corpus lutein.  DESCRIPTION OF PROCEDURE:     The patient was taken to the operating room after an appropriate identification and placed on the operating room table. After the obtaining of adequate general anesthesia, she was placed in the modified lithotomy position.  The abdomen, perineum, and vagina were prepped with multiple layers of Betadine, and a red Robinson catheter used to empty the bladder.  A single tooth tenaculum was placed on the anterior cervix. The abdomen was draped as a sterile field.  A subumbilical injection of 0.25% Marcaine 6 cc was undertaken.  A subumbilical incision was made, and the Veress cannula placed through that incision into the peritoneal cavity.  A pneumoperitoneum was created with 3.5 L of CO2.  The Veress cannula was removed, and the laparoscopic trocar placed through that incision into the peritoneal cavity.  The laparoscope was placed through the trocar sleeve.  The cautery mechanism was placed through the operating channel of the laparoscope, and the right fallopian tube grasped at the isthmic portion after being followed to its fimbriated end, and cauterized in two adjacent areas at  the isthmic portion.  Hemostasis was noted to be adequate.  A similar procedure was carried out on the opposite side.  All instruments were then removed from the peritoneal cavity under direct visualization, as the PO2 was allowed to escape.  The subumbilical incision was closed with a subcuticular suture of #3-0 Vicryl.  A sterile dressing was applied.  The single tooth tenaculum was removed from the cervix.  There was excessive bleeding from the cervix, and the tenaculum site, and this was cauterized with silver nitrate.  The patient was awakened from general anesthesia, and taken to the recovery room in satisfactory condition, having tolerated the procedure well, with the sponge and instrument counts correct. DD:  08/30/00 TD:  08/30/00 Job: [id number]

## 2010-12-04 NOTE — Discharge Summary (Signed)
NAMERANE, DUMM                 ACCOUNT NO.:  [id number]   MEDICAL RECORD NO.:  [id number]          PATIENT TYPE:  INP   LOCATION:  3015                         FACILITY:  [facility]   PHYSICIAN:  Fleet Contras, M.D.    DATE OF BIRTH:  November 06, 1965   DATE OF ADMISSION:  03/27/2008  DATE OF DISCHARGE:  04/01/2008                               DISCHARGE SUMMARY   ADMISSION DIAGNOSES:  1. Obstructive jaundice.  2. Distended gallbladder.  3. Chronic alcohol abuse.   HISTORY OF PRESENT ILLNESS:  Ms. Sedlak is a 45 year old African American  lady who presented to the emergency room with 20 pounds weight loss over  3 months associated with progressively yellowing of the eyes and hands.  She had been using alcohol and Tylenol in excessive amount recently.  She was admitted to the hospital for further evaluation.   HOSPITAL COURSE:  On admission, the patient was put on medical bed,  placed on clear liquids, gentle IV fluid hydration was started and  multivitamin, folic acid, thiamine, oxycodone 5 mg 1-2 pills q.6 hours  was given for chronic pain and Gastroenterology consult was requested.  The patient was kindly seen by Dr. Matthias Hughs, who thought that enzyme  level was too high for just alcoholic hepatitis with this possible toxin  exposure.  Surgery consult was requested because of distended  gallbladder.  The patient was kindly seen by Dr. Colin Benton, but no surgical  intervention was recommended.  Condition improved during the course of  hospitalization with decrease of AST, ALT, and alkaline phosphatase as  well as bilirubin.  Tumor markers were performed.  CA19-9 were elevated.  Pelvic ultrasound scan was negative and she was therefore considered  stable for discharge home on April 01, 2008.   DISCHARGE DIAGNOSES:  1. Acute hepatitis due to alcohol and Tylenol.  2. Alcohol abuse.  3. Anxiety.  4. Rhabdomyolysis.  5. Family history of colon cancer.   She therefore underwent a  proctocolonoscopy to the cecum which was  normal on April 01, 2008 prior to discharge home.   DISPOSITION:  For home.   CONDITION ON DISCHARGE:  Stable.   DISCHARGE MEDICATIONS:  1. Protonix 40 mg daily.  2. Ambien 10 mg at bedtime.  3. Multivitamin once a day.  4. Folic acid 1 mg daily.  5. Lorazepam 0.5 mg b.i.d. p.r.n.  6. Thiamine 100 mg daily.   DISCHARGE INSTRUCTIONS:  She was strongly advised to quit alcohol and  Tylenol use.      Fleet Contras, M.D.  Electronically Signed     EA/MEDQ  D:  05/30/2008  T:  05/30/2008  Job:  [id number]

## 2011-04-08 LAB — POCT I-STAT CREATININE: Creatinine, Ser: 0.7

## 2011-04-08 LAB — RAPID URINE DRUG SCREEN, HOSP PERFORMED
Barbiturates: NOT DETECTED
Cocaine: NOT DETECTED
Opiates: NOT DETECTED

## 2011-04-08 LAB — POCT PREGNANCY, URINE
Operator id: 133351
Preg Test, Ur: NEGATIVE

## 2011-04-08 LAB — I-STAT 8, (EC8 V) (CONVERTED LAB)
Acid-base deficit: 1
BUN: 5 — ABNORMAL LOW
Bicarbonate: 23.5
Chloride: 103
HCT: 44
Hemoglobin: 15
Operator id: 192351
Sodium: 133 — ABNORMAL LOW
pCO2, Ven: 37.5 — ABNORMAL LOW

## 2011-04-08 LAB — URINE MICROSCOPIC-ADD ON

## 2011-04-08 LAB — DIFFERENTIAL
Lymphocytes Relative: 7 — ABNORMAL LOW
Monocytes Absolute: 1
Monocytes Relative: 12
Neutro Abs: 6.5
Neutrophils Relative %: 80 — ABNORMAL HIGH

## 2011-04-08 LAB — CBC
Hemoglobin: 12.8
RBC: 4.09
WBC: 8.1

## 2011-04-08 LAB — URINALYSIS, ROUTINE W REFLEX MICROSCOPIC
Glucose, UA: 100 — AB
Ketones, ur: 15 — AB
Protein, ur: 100 — AB
Urobilinogen, UA: 1

## 2011-04-08 LAB — ETHANOL: Alcohol, Ethyl (B): <5

## 2011-04-21 LAB — CBC
HCT: 29.6 — ABNORMAL LOW
HCT: 31.9 — ABNORMAL LOW
HCT: 35 — ABNORMAL LOW
HCT: 37.6
Hemoglobin: 10.2 — ABNORMAL LOW
Hemoglobin: 11.3 — ABNORMAL LOW
Hemoglobin: 12.3
Hemoglobin: 9.6 — ABNORMAL LOW
MCHC: 32
MCHC: 32.3
MCHC: 32.6
MCV: 94.2
MCV: 95.4
MCV: 96.7
Platelets: 149 — ABNORMAL LOW
RBC: 3.06 — ABNORMAL LOW
RBC: 3.29 — ABNORMAL LOW
RBC: 3.99
RDW: 16.3 — ABNORMAL HIGH
RDW: 16.9 — ABNORMAL HIGH
WBC: 5.3

## 2011-04-21 LAB — HEPATITIS PANEL, ACUTE: Hep B C IgM: NEGATIVE

## 2011-04-21 LAB — HEPATIC FUNCTION PANEL
ALT: 196 — ABNORMAL HIGH
ALT: 247 — ABNORMAL HIGH
AST: 332 — ABNORMAL HIGH
Albumin: 2.7 — ABNORMAL LOW
Alkaline Phosphatase: 176 — ABNORMAL HIGH
Alkaline Phosphatase: 223 — ABNORMAL HIGH
Bilirubin, Direct: 0.8 — ABNORMAL HIGH
Bilirubin, Direct: 1.2 — ABNORMAL HIGH
Bilirubin, Direct: 2.4 — ABNORMAL HIGH
Indirect Bilirubin: 1 — ABNORMAL HIGH
Indirect Bilirubin: 1 — ABNORMAL HIGH
Indirect Bilirubin: 1.9 — ABNORMAL HIGH
Total Bilirubin: 2.4 — ABNORMAL HIGH
Total Protein: 5.9 — ABNORMAL LOW
Total Protein: 5.9 — ABNORMAL LOW
Total Protein: 7.8

## 2011-04-21 LAB — DIFFERENTIAL
Eosinophils Absolute: 0.1
Eosinophils Relative: 2
Lymphocytes Relative: 35
Lymphs Abs: 1.6
Myelocytes: 0
Neutro Abs: 2.6
Neutrophils Relative %: 55
nRBC: 0

## 2011-04-21 LAB — BASIC METABOLIC PANEL
Chloride: 102
GFR calc Af Amer: >60
Potassium: 3.6
Sodium: 135

## 2011-04-21 LAB — URINALYSIS, ROUTINE W REFLEX MICROSCOPIC
Glucose, UA: NEGATIVE
Nitrite: NEGATIVE
Specific Gravity, Urine: 1.019
pH: 5.5

## 2011-04-21 LAB — URINE MICROSCOPIC-ADD ON

## 2011-04-21 LAB — PROTIME-INR
INR: 0.9
Prothrombin Time: 12.7

## 2011-04-21 LAB — COMPREHENSIVE METABOLIC PANEL
ALT: 245 — ABNORMAL HIGH
AST: 521 — ABNORMAL HIGH
BUN: 1 — ABNORMAL LOW
BUN: 2 — ABNORMAL LOW
CO2: 26
CO2: 27
Calcium: 9.3
Calcium: 9.5
Chloride: 98
Creatinine, Ser: 0.71
Creatinine, Ser: 0.73
GFR calc Af Amer: >60
GFR calc non Af Amer: >60
GFR calc non Af Amer: >60
Glucose, Bld: 104 — ABNORMAL HIGH
Glucose, Bld: 112 — ABNORMAL HIGH
Sodium: 133 — ABNORMAL LOW
Total Bilirubin: 3.1 — ABNORMAL HIGH
Total Protein: 7.1

## 2011-04-21 LAB — SEDIMENTATION RATE: Sed Rate: 7

## 2011-04-21 LAB — CK TOTAL AND CKMB (NOT AT ARMC)
Relative Index: 0.3
Total CK: 266 — ABNORMAL HIGH

## 2011-04-21 LAB — MYOGLOBIN, SERUM: Myoglobin: 28

## 2011-04-21 LAB — AMYLASE: Amylase: 33

## 2011-04-21 LAB — LACTATE DEHYDROGENASE: LDH: 377 — ABNORMAL HIGH

## 2011-04-21 LAB — AMMONIA: Ammonia: 64 — ABNORMAL HIGH

## 2011-04-21 LAB — TSH: TSH: 2.209

## 2011-04-21 LAB — APTT: aPTT: 25

## 2011-04-30 LAB — RAPID URINE DRUG SCREEN, HOSP PERFORMED
Amphetamines: NOT DETECTED
Barbiturates: NOT DETECTED
Benzodiazepines: NOT DETECTED

## 2011-04-30 LAB — I-STAT 8, (EC8 V) (CONVERTED LAB)
BUN: 4 — ABNORMAL LOW
Bicarbonate: 27.6 — ABNORMAL HIGH
Glucose, Bld: 101 — ABNORMAL HIGH
Hemoglobin: 16.7 — ABNORMAL HIGH
Sodium: 135

## 2011-04-30 LAB — ETHANOL: Alcohol, Ethyl (B): <5

## 2012-10-11 ENCOUNTER — Encounter: Payer: Self-pay | Admitting: Internal Medicine

## 2012-10-27 ENCOUNTER — Encounter: Payer: Self-pay | Admitting: Internal Medicine

## 2012-11-06 ENCOUNTER — Ambulatory Visit: Payer: Self-pay | Admitting: Internal Medicine

## 2014-11-14 ENCOUNTER — Other Ambulatory Visit: Payer: Self-pay

## 2014-11-14 DIAGNOSIS — Z1231 Encounter for screening mammogram for malignant neoplasm of breast: Secondary | ICD-10-CM

## 2014-11-19 ENCOUNTER — Ambulatory Visit: Payer: Self-pay

## 2015-04-11 ENCOUNTER — Ambulatory Visit
Admission: RE | Admit: 2015-04-11 | Discharge: 2015-04-11 | Disposition: A | Discharge status: Home / Self Care | Payer: Medicare Other | Source: Ambulatory Visit

## 2015-04-11 DIAGNOSIS — Z1231 Encounter for screening mammogram for malignant neoplasm of breast: Secondary | ICD-10-CM

## 2015-04-15 ENCOUNTER — Other Ambulatory Visit: Payer: Self-pay | Admitting: Internal Medicine

## 2015-04-15 DIAGNOSIS — R928 Other abnormal and inconclusive findings on diagnostic imaging of breast: Secondary | ICD-10-CM

## 2015-04-23 ENCOUNTER — Ambulatory Visit
Admission: RE | Admit: 2015-04-23 | Discharge: 2015-04-23 | Disposition: A | Discharge status: Home / Self Care | Payer: Medicare Other | Source: Ambulatory Visit | Attending: Internal Medicine | Admitting: Internal Medicine

## 2015-04-23 DIAGNOSIS — R928 Other abnormal and inconclusive findings on diagnostic imaging of breast: Secondary | ICD-10-CM

## 2015-09-12 ENCOUNTER — Telehealth: Payer: Self-pay | Admitting: Genetic Counselor

## 2015-09-12 NOTE — Telephone Encounter (Signed)
Busy signal when calling to schedule appt

## 2015-09-24 ENCOUNTER — Encounter: Payer: Self-pay | Admitting: Genetic Counselor

## 2015-09-24 ENCOUNTER — Telehealth: Payer: Self-pay | Admitting: Genetic Counselor

## 2015-09-24 NOTE — Telephone Encounter (Signed)
Verified address and insurance, faxed referring provider Arvella Nigh); mailed new pt packet; scheduled intake

## 2015-10-16 ENCOUNTER — Ambulatory Visit (HOSPITAL_BASED_OUTPATIENT_CLINIC_OR_DEPARTMENT_OTHER): Payer: Medicare Other | Admitting: Genetic Counselor

## 2015-10-16 ENCOUNTER — Encounter: Payer: Self-pay | Admitting: Genetic Counselor

## 2015-10-16 ENCOUNTER — Other Ambulatory Visit: Payer: Medicare Other

## 2015-10-16 DIAGNOSIS — Z8371 Family history of colonic polyps: Secondary | ICD-10-CM

## 2015-10-16 DIAGNOSIS — Z8049 Family history of malignant neoplasm of other genital organs: Secondary | ICD-10-CM | POA: Diagnosis not present

## 2015-10-16 DIAGNOSIS — Z809 Family history of malignant neoplasm, unspecified: Secondary | ICD-10-CM

## 2015-10-16 DIAGNOSIS — Z86010 Personal history of colonic polyps: Secondary | ICD-10-CM

## 2015-10-16 DIAGNOSIS — Z315 Encounter for genetic counseling: Secondary | ICD-10-CM | POA: Diagnosis not present

## 2015-10-16 DIAGNOSIS — Z8041 Family history of malignant neoplasm of ovary: Secondary | ICD-10-CM | POA: Insufficient documentation

## 2015-10-16 DIAGNOSIS — Z800 Family history of malignant neoplasm of digestive organs: Secondary | ICD-10-CM | POA: Insufficient documentation

## 2015-10-16 DIAGNOSIS — Z803 Family history of malignant neoplasm of breast: Secondary | ICD-10-CM | POA: Insufficient documentation

## 2015-10-16 DIAGNOSIS — Z8042 Family history of malignant neoplasm of prostate: Secondary | ICD-10-CM

## 2015-10-16 NOTE — Progress Notes (Signed)
REFERRING PROVIDER: John McComb, MD [address] [place], [place] [address]  PRIMARY PROVIDER:  No primary care provider on file.  PRIMARY REASON FOR VISIT:  1. Family history of colon cancer   2. Family history of ovarian cancer   3. Family history of breast cancer in female   4. History of colonic polyps   5. Family history of uterine cancer   6. Family history of prostate cancer   7. Family history of cancer   8. Family history of colonic polyps      HISTORY OF PRESENT ILLNESS:   Ms. [name], a 50 y.o. female, was seen for a [place] cancer genetics consultation at the request of Dr. McComb due to a family history of multiple early-onset colon cancers, family history of ovarian, uterine, breast, prostate, and other cancers, and personal and family history of colon polyps.  Ms. [name] presents to clinic today to discuss the possibility of a hereditary predisposition to cancer, genetic testing, and to further clarify her future cancer risks, as well as potential cancer risks for family members.    Ms. [name] is a 50 y.o. female with no personal history of cancer.    CANCER HISTORY:   No history exists.     HORMONAL RISK FACTORS:  Menarche was at age 15.  First live birth at age 21.  OCP use for approximately depo provera shot "a few times"  Ovaries intact: yes.  Hysterectomy: no.  Menopausal status: perimenopausal - period irregular HRT use: 0 years. Colonoscopy: yes; 2 tubular adenomas found in cecum in 06/2004 at the age of 50. Mammogram within the last year: yes, but has had some issues with getting insurance to cover Number of breast biopsies: 0. Up to date with pelvic exams:  yes. Any excessive radiation exposure in the past:  Reports history of lots of x-rays due to multiple surgeries on her leg; reports history of secondhand smoke exposure (family members smoke)   Social History   Social History  . Marital Status: Widowed    Spouse Name: N/A  . Number  of Children: N/A  . Years of Education: N/A   Social History Main Topics  . Smoking status: Never Smoker   . Smokeless tobacco: Never Used  . Alcohol Use: Yes     Comment: hx of EtOH abuse; only drinks twice a yr now, but then "drinks heavy"  . Drug Use: None  . Sexual Activity: Not Asked   Other Topics Concern  . None   Social History Narrative  . None     FAMILY HISTORY:  We obtained a detailed, 4-generation family history.  Significant diagnoses are listed below: Family History  Problem Relation Age of Onset  . Other Mother     hx of TAH-BSO in her late 20s-30 for unspecified cause  . Colon cancer Father     dx. early-mid-40s with later "recurrence"  . Ovarian cancer Sister 40  . Colon cancer Brother     dx. 30s; +surgery and chemotherapy  . Colon cancer Paternal Aunt 63  . Colon cancer Paternal Uncle 55  . Cancer Maternal Grandmother     NOS cancer, d. 30s  . Breast cancer Paternal Grandmother     d. early 70s  . Colon polyps Other     "many"; dx. late 30s or younger  . Other Daughter     breast issues--infection and fluid had to be removed; also has issues with on-going periods; dx. 15 or younger  .   Other Daughter     cyst on her fallopian tube, being monitored; dx. 26 or younger  . Other Brother     issues with recent illness; bloodwork to check for cancer - age 51  . Other Brother     issues with swollen prostate; GI issues; age 40  . Cancer Cousin 51    paternal 1st cousin dx. "bowel" cancer  . Colon cancer Cousin 46    paternal 1st cousin  . Colon cancer Cousin 49    paternal 1st cousin  . Colon cancer Cousin 54    paternal 1st cousin  . Colon cancer Cousin     paternal 1st cousin at unspecified age  . Colon cancer Cousin 32    daughter of affected paternal 1st cousin  . Uterine cancer Cousin     paternal 1st cousin, d. 30s  . Cancer Brother     paternal half-brother dx. in his 30s; NOS cancer  . Prostate cancer Brother     paternal half-brother  d. 30s    Ms. Ledger has two sons and two daughters, ages 15-28.  Her two sons have children of their own.  Ms. Graber has three full brothers, ages 40-51.  One brother was diagnosed with colon cancer in his 30s and underwent surgery and chemotherapy.  Ms. Peri also has one maternal half-sister who died of ovarian cancer at the age of 40.  This sister has two daughters and one son--one of her daughters has a history of "many" colon polyps.  Ms. Easterwood also has three paternal half-sisters and two paternal half-brothers.  One of her paternal half-brothers was diagnosed with prostate cancer in his 30s and has passed away; the other half-brother is currently in his 60s, but has a history of an unspecified type of cancer which was diagnosed in his 30s.    Ms. Mcgrady's mother is currently 74 and has not been diagnosed with cancer.  She underwent a TAH-BSO in her late 20s-30s for an unspecified cause.  Ms. Wingard's mother has four full sisters and six full brothers.  One sister died in her 40s of an unspecified cause, while she was pregnant.  One brother died in his mid-30s, which may have been due to stomach/abdominal bleeding.  The other aunts/uncles are in their 60s-70s and have never had cancer.  Ms. Belmonte reports no known cancers for her maternal first cousins.  Her maternal grandmother died in her 30s of an unspecified type of cancer which also resulted in stomach/abdominal bleeding.  Ms. Andres's maternal grandfather died in his 40s.  She has no further information for her grandparents, great aunts/uncles or great grandparents.  Ms. Perdew's father died of metastatic colon cancer at 58.  He was first diagnosed with colon cancer in his early-mid 40s.  He had two full brothers and one full sister.  One brother died in a motorcycle accident at the age of 28.  The other brother died of colon cancer at the age of 55; this brother had one daughter who died of uterine cancer in her 30s.  Ms. Provencal's paternal aunt is  currently 71 and was diagnosed with colon cancer at the age of 63.  This aunt had three sons and two daughters, and Ms. Gambrill reports that each of these cousins have had colon cancer (or "bowel cancer"), all diagnosed in their mid-40s to mid-50s.  One cousin has a daughter who was also diagnosed with colon cancer at the age of 32.  Ms. Mickler reports   that some of these family members live in different states, some live in [place], [place].  She does not think that any of them have had genetic testing.  Ms. Haislip paternal grandmother died of breast cancer in her early 86s.  Her paternal grandfather has passed, but she never met him.  She has no further information for any paternal great aunts/uncles or great grandparents, but did report hearing that several of her grandmother's siblings died of cancer.    Patient's maternal ancestors are of [place] American and Native American - [place] descent, and paternal ancestors are of [place] American and Caucasian descent. There is no reported Ashkenazi Jewish ancestry. There is no known consanguinity.  GENETIC COUNSELING ASSESSMENT: BAYLYNN SHIFFLETT is a 51 y.o. female with a family history of colon and other cancers which is somewhat suggestive of Lynch syndrome or another hereditary colon cancer syndrome and predisposition to colon and other related cancers. We, therefore, discussed and recommended the following at today's visit.   DISCUSSION: We reviewed the characteristics, features and inheritance patterns of hereditary cancer syndromes, particularly those caused by mutations within the Lynch syndrome and BRCA1/2 genes (due to the ovarian cancer for her half-sister). We also discussed genetic testing, including the appropriate family members to test, the process of testing, insurance coverage and turn-around-time for results. We discussed the implications of a negative, positive and/or variant of uncertain significant result. We recommended Ms. Burgher pursue genetic  testing for the 32-gene Comprehensive Cancer Panel with MSH2 Exons 1-7 Inversion Analysis through [facility].  The Comprehensive Cancer Panel offered by [facility] includes sequencing and/or deletion duplication testing of the following 32 genes: APC, ATM, AXIN2, BARD1, BMPR1A, BRCA1, BRCA2, BRIP1, CDH1, CDK4, CDKN2A, CHEK2, EPCAM, FANCC, MLH1, MSH2, MSH6, MUTYH, NBN, PALB2, PMS2, POLD1, POLE, PTEN, RAD51C, RAD51D, SCG5/GREM1, SMAD4, STK11, TP53, VHL, and XRCC2.     Based on Ms. Cavitt's family history of cancer, she meets medical criteria for genetic testing. Despite that she meets criteria, she may still have an out of pocket cost. We discussed that if her out of pocket cost for testing is over $100, the laboratory will call and confirm whether she wants to proceed with testing.  If the out of pocket cost of testing is less than $100 she will be billed by the genetic testing laboratory.   PLAN: After considering the risks, benefits, and limitations, Ms. Staton  provided informed consent to pursue genetic testing and the blood sample was sent to [facility] for analysis of the 32-gene Comprehensive Cancer Panel with MSH2 inversion analysis. Results should be available within approximately 2-3 weeks' time, at which point they will be disclosed by telephone to Ms. Bruneau, as will any additional recommendations warranted by these results. Ms. Fronczak will receive a summary of her genetic counseling visit and a copy of her results once available. This information will also be available in Epic. We encouraged Ms. Desantis to remain in contact with cancer genetics annually so that we can continuously update the family history and inform her of any changes in cancer genetics and testing that may be of benefit for her family. Ms. Freeburg questions were answered to her satisfaction today. Our contact information was provided should additional questions or concerns arise.  Thank you for the referral and allowing  [place] to share in the care of your patient.   Jeanine Luz, MS, [facility] Certified Genetic Counselor [email or web address] Phone: [phone]  The patient was seen for a total of 60 minutes in face-to-face genetic counseling.  This patient was discussed with Drs. Magrinat, Gudena and/or Feng who agrees with the above.    _______________________________________________________________________ For Office Staff:  Number of people involved in session: 1 Was an Intern/ student involved with case: no    

## 2015-10-31 ENCOUNTER — Telehealth: Payer: Self-pay | Admitting: Genetic Counselor

## 2015-11-07 NOTE — Telephone Encounter (Signed)
LM for [name] to let her know that her test results are back.  Asked her to CB at [phone].

## 2015-11-12 ENCOUNTER — Telehealth: Payer: Self-pay | Admitting: Genetic Counselor

## 2015-12-12 NOTE — Telephone Encounter (Signed)
Have tried to call patient to deliver test results multiple times.  Have been unsuccessful.  Non-responder letter also sent, and has not been successful.

## 2015-12-29 ENCOUNTER — Telehealth: Payer: Self-pay | Admitting: Genetic Counselor

## 2015-12-29 ENCOUNTER — Encounter: Payer: Self-pay | Admitting: Genetic Counselor

## 2015-12-29 NOTE — Telephone Encounter (Signed)
Ms. [name] finally called back to our offices.  Discussed that she had a positive genetic test result.  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  Discussed that this means she is at a high risk for colorectal cancer, uterine, ovarian, and stomach cancers.  Also briefly discussed the increased risk for other cancers such as certain brain tumors, renal cancers, genitourinary cancers, hepatobiliary cancers.  Stressed the importance of her needing a colonoscopy every 1-2 years now as well as needing a hysterectomy and removing her ovaries and fallopian tubes, since she has completed her family.  Discussed that this result very likely explains the paternal family history of early-onset colon cancer.  However, she did ask if her mother needed genetic testing, so I let her know that we could test her mother just to rule out the maternal family risk for Lynch syndrome (since her father has passed away).  Let her know her children all have a 50% of having inherited this same mutation--her three oldest children need testing as soon as possible.  Her youngest daughter is currently too young to be tested, but will need to have testing once she is 38 or older.  Ms. [name] siblings have a 50% chance of having inherited this as well.  Her brother [name] has already had colon cancer in his 34s, so we discussed that it is very likely that he will end up testing positive, but that it is important to know this for sure because his kids will then need to have testing.  It seems like many of these paternal relatives and siblings are still living in [place], [place], or nearby areas.  Ms. [name] is not aware whether other relatives have had testing, however, and she is not in good communication with paternal relatives since her father's passing.  She did not want to come in to discuss results at the beginning of our conversation, but seemed to come around to the idea at the  end of our phone call.  She mentioned bringing her mother with her.  She has my phone number to call and set up a follow-up appointment, and I think this will be really good for her further understanding, if she does decide to come in.  I let her know I will let Dr. Radene Knee know of her results and that I will also send her a copy as well as a results letter.  She has my number if she or her family members have any questions.

## 2015-12-30 ENCOUNTER — Other Ambulatory Visit: Payer: Self-pay | Admitting: Obstetrics and Gynecology

## 2015-12-30 DIAGNOSIS — N6489 Other specified disorders of breast: Secondary | ICD-10-CM

## 2015-12-31 ENCOUNTER — Ambulatory Visit: Payer: Self-pay | Admitting: Genetic Counselor

## 2015-12-31 DIAGNOSIS — Z86010 Personal history of colonic polyps: Secondary | ICD-10-CM

## 2015-12-31 DIAGNOSIS — Z800 Family history of malignant neoplasm of digestive organs: Secondary | ICD-10-CM

## 2015-12-31 DIAGNOSIS — Z8041 Family history of malignant neoplasm of ovary: Secondary | ICD-10-CM

## 2015-12-31 DIAGNOSIS — Z803 Family history of malignant neoplasm of breast: Secondary | ICD-10-CM

## 2015-12-31 DIAGNOSIS — Z1509 Genetic susceptibility to other malignant neoplasm: Secondary | ICD-10-CM

## 2015-12-31 DIAGNOSIS — Z809 Family history of malignant neoplasm, unspecified: Secondary | ICD-10-CM

## 2015-12-31 DIAGNOSIS — Z1379 Encounter for other screening for genetic and chromosomal anomalies: Secondary | ICD-10-CM

## 2016-01-06 ENCOUNTER — Ambulatory Visit
Admission: RE | Admit: 2016-01-06 | Discharge: 2016-01-06 | Disposition: A | Discharge status: Home / Self Care | Payer: Medicare Other | Source: Ambulatory Visit | Attending: Obstetrics and Gynecology | Admitting: Obstetrics and Gynecology

## 2016-01-06 ENCOUNTER — Other Ambulatory Visit: Payer: Self-pay | Admitting: Obstetrics and Gynecology

## 2016-01-06 DIAGNOSIS — N6489 Other specified disorders of breast: Secondary | ICD-10-CM

## 2016-01-06 DIAGNOSIS — N6320 Unspecified lump in the left breast, unspecified quadrant: Secondary | ICD-10-CM

## 2016-01-08 ENCOUNTER — Other Ambulatory Visit: Payer: Self-pay | Admitting: Gastroenterology

## 2016-01-12 ENCOUNTER — Encounter: Payer: Self-pay | Admitting: Genetic Counselor

## 2016-01-26 ENCOUNTER — Ambulatory Visit: Payer: Medicare Other | Admitting: Podiatry

## 2016-01-30 ENCOUNTER — Encounter (HOSPITAL_COMMUNITY): Payer: Self-pay | Admitting: *Deleted

## 2016-02-09 ENCOUNTER — Encounter (HOSPITAL_COMMUNITY): Payer: Self-pay | Admitting: Registered Nurse

## 2016-02-09 ENCOUNTER — Encounter (HOSPITAL_COMMUNITY)
Admission: RE | Disposition: A | Discharge status: Home / Self Care | Payer: Self-pay | Source: Ambulatory Visit | Attending: Gastroenterology

## 2016-02-09 ENCOUNTER — Ambulatory Visit (HOSPITAL_COMMUNITY): Payer: Medicare Other | Admitting: Registered Nurse

## 2016-02-09 ENCOUNTER — Ambulatory Visit (HOSPITAL_COMMUNITY)
Admission: RE | Admit: 2016-02-09 | Discharge: 2016-02-09 | Disposition: A | Discharge status: Home / Self Care | Payer: Medicare Other | Source: Ambulatory Visit | Attending: Gastroenterology | Admitting: Gastroenterology

## 2016-02-09 DIAGNOSIS — Z1509 Genetic susceptibility to other malignant neoplasm: Secondary | ICD-10-CM | POA: Insufficient documentation

## 2016-02-09 DIAGNOSIS — Z86010 Personal history of colonic polyps: Secondary | ICD-10-CM | POA: Insufficient documentation

## 2016-02-09 DIAGNOSIS — Z800 Family history of malignant neoplasm of digestive organs: Secondary | ICD-10-CM | POA: Insufficient documentation

## 2016-02-09 DIAGNOSIS — R1110 Vomiting, unspecified: Secondary | ICD-10-CM | POA: Insufficient documentation

## 2016-02-09 DIAGNOSIS — Z8049 Family history of malignant neoplasm of other genital organs: Secondary | ICD-10-CM | POA: Insufficient documentation

## 2016-02-09 DIAGNOSIS — Z1211 Encounter for screening for malignant neoplasm of colon: Secondary | ICD-10-CM | POA: Insufficient documentation

## 2016-02-09 HISTORY — PX: COLONOSCOPY WITH PROPOFOL: SHX5780

## 2016-02-09 HISTORY — PX: ESOPHAGOGASTRODUODENOSCOPY (EGD) WITH PROPOFOL: SHX5813

## 2016-02-09 SURGERY — COLONOSCOPY WITH PROPOFOL
Anesthesia: Monitor Anesthesia Care

## 2016-02-09 MED ORDER — PROPOFOL 10 MG/ML IV BOLUS
INTRAVENOUS | Status: AC
  Filled 2016-02-09: qty 20

## 2016-02-09 MED ORDER — GLYCOPYRROLATE 0.2 MG/ML IJ SOLN
INTRAMUSCULAR | Status: AC
  Filled 2016-02-09: qty 1

## 2016-02-09 MED ORDER — LIDOCAINE HCL (CARDIAC) 20 MG/ML IV SOLN
INTRAVENOUS | Status: AC
  Filled 2016-02-09: qty 5

## 2016-02-09 MED ORDER — PROPOFOL 500 MG/50ML IV EMUL
INTRAVENOUS | Status: DC | PRN
  Administered 2016-02-09: 300 ug/kg/min via INTRAVENOUS

## 2016-02-09 MED ORDER — LACTATED RINGERS IV SOLN
INTRAVENOUS | Status: DC | PRN
  Administered 2016-02-09: 13:00:00 via INTRAVENOUS

## 2016-02-09 MED ORDER — ONDANSETRON HCL 4 MG/2ML IJ SOLN
INTRAMUSCULAR | Status: DC | PRN
  Administered 2016-02-09: 4 mg via INTRAVENOUS

## 2016-02-09 MED ORDER — SODIUM CHLORIDE 0.9 % IV SOLN
INTRAVENOUS | Status: DC | PRN
  Administered 2016-02-09: 15:00:00 via INTRAVENOUS

## 2016-02-09 MED ORDER — PROPOFOL 10 MG/ML IV BOLUS
INTRAVENOUS | Status: AC
  Filled 2016-02-09: qty 40

## 2016-02-09 SURGICAL SUPPLY — 25 items

## 2016-02-09 NOTE — Transfer of Care (Signed)
Immediate Anesthesia Transfer of Care Note  Patient: [name]  Procedure(s) Performed: Procedure(s): COLONOSCOPY WITH PROPOFOL (N/A) ESOPHAGOGASTRODUODENOSCOPY (EGD) WITH PROPOFOL (N/A)  Patient Location: PACU  Anesthesia Type:MAC  Level of Consciousness: awake, alert , oriented and patient cooperative  Airway & Oxygen Therapy: Patient Spontanous Breathing and Patient connected to nasal cannula oxygen  Post-op Assessment: Report given to RN, Post -op Vital signs reviewed and stable and Patient moving all extremities X 4  Post vital signs: stable  Last Vitals:  Vitals:   02/09/16 1230 02/09/16 1444  BP: 129/69 113/63  Pulse: 70 95  Resp: 20 14  Temp: 36.7 C     Last Pain:  Vitals:   02/09/16 1230  TempSrc: Oral         Complications: No apparent anesthesia complications

## 2016-02-09 NOTE — H&P (Signed)
  Patient name [name], Auch DICTATION# [id number] CSN# [id number]  [name], MD 02/09/2016 1:19 PM

## 2016-02-09 NOTE — H&P (Signed)
  Problem: Lynch syndrome. MLH1 mutation. History of adenomatous colon polyps removed colonoscopically in the past  History: The patient is a 50 year old female born 22-Dec-1965. She has Lynch syndrome. She is scheduled to undergo prophylactic total abdominal hysterectomy with bilateral salpingo-oophorectomy. Her brother died of colon cancer in his late 69s. Her father was diagnosed with colon cancer. Her sister was diagnosed with uterine cancer.  The patient is scheduled to undergo surveillance esophagogastroduodenoscopy and colonoscopy.  Medication allergies: None  Past medical history: Gastroesophageal reflux. Lactose intolerance. Foot surgery.  Exam: Patient is alert and lying comfortably on the endoscopy stretcher. Abdomen is soft and nontender to palpation. Lungs are clear to auscultation. Cardiac exam reveals a regular rhythm.  Plan: Proceed with surveillance esophagogastroduodenoscopy and colonoscopy.

## 2016-02-09 NOTE — Anesthesia Postprocedure Evaluation (Signed)
Anesthesia Post Note  Patient: [name]  Procedure(s) Performed: Procedure(s) (LRB): COLONOSCOPY WITH PROPOFOL (N/A) ESOPHAGOGASTRODUODENOSCOPY (EGD) WITH PROPOFOL (N/A)  Patient location during evaluation: PACU Anesthesia Type: MAC Level of consciousness: awake and alert Pain management: pain level controlled Vital Signs Assessment: post-procedure vital signs reviewed and stable Respiratory status: spontaneous breathing, nonlabored ventilation and respiratory function stable Cardiovascular status: stable and blood pressure returned to baseline Anesthetic complications: no    Last Vitals:  Vitals:   02/09/16 1500 02/09/16 1510  BP: 121/70 133/77  Pulse: 85 84  Resp: (!) 21 (!) 22  Temp:      Last Pain:  Vitals:   02/09/16 1230  TempSrc: Oral                 Lodema Parma,W. EDMOND

## 2016-02-09 NOTE — Op Note (Signed)
[facility] Patient Name: [name] Procedure Date: 02/09/2016 MRN: [id number] Attending MD: Garlan Fair , MD Date of Birth: Nov 17, 1965 CSN: [id number] Age: 50 Admit Type: Outpatient Procedure:                Colonoscopy Indications:              Screening in patient at increased risk:Personal                            history of non-advanced adenoma. Lynch Syndrome                            MLH1 mutation Providers:                Garlan Fair, MD, Hilma Favors, RN, Alfonso Patten, Technician, Enrigue Catena, CRNA Referring MD:              Medicines:                Propofol per Anesthesia Complications:            No immediate complications. Estimated Blood Loss:     Estimated blood loss: none. Procedure:                Pre-Anesthesia Assessment:                           - Prior to the procedure, a History and Physical                            was performed, and patient medications and                            allergies were reviewed. The patient's tolerance of                            previous anesthesia was also reviewed. The risks                            and benefits of the procedure and the sedation                            options and risks were discussed with the patient.                            All questions were answered, and informed consent                            was obtained. Prior Anticoagulants: The patient has                            taken no previous anticoagulant or antiplatelet                            agents. ASA  Grade Assessment: II - A patient with                            mild systemic disease. After reviewing the risks                            and benefits, the patient was deemed in                            satisfactory condition to undergo the procedure.                           After obtaining informed consent, the colonoscope                            was passed under direct  vision. Throughout the                            procedure, the patient's blood pressure, pulse, and                            oxygen saturations were monitored continuously. The                            EC-[id number] ([id number]) scope was introduced through                            the anus and advanced to the the cecum, identified                            by appendiceal orifice and ileocecal valve. The                            colonoscopy was performed without difficulty. The                            patient tolerated the procedure well. The quality                            of the bowel preparation was good. The terminal                            ileum, the ileocecal valve, the appendiceal orifice                            and the rectum were photographed. Scope In: 2:18:34 PM Scope Out: 2:34:14 PM Scope Withdrawal Time: 0 hours 8 minutes 10 seconds  Total Procedure Duration: 0 hours 15 minutes 40 seconds  Findings:      The perianal and digital rectal examinations were normal.      The entire examined colon appeared normal. Impression:               - The entire examined colon is normal.                           -  No specimens collected. Moderate Sedation:      N/A- Per Anesthesia Care Recommendation:           - Patient has a contact number available for                            emergencies. The signs and symptoms of potential                            delayed complications were discussed with the                            patient. Return to normal activities tomorrow.                            Written discharge instructions were provided to the                            patient.                           - Repeat colonoscopy in 2 years for surveillance.                           - Resume previous diet.                           - Continue present medications. Procedure Code(s):        --- Professional ---                           [phone], Colonoscopy, flexible;  diagnostic, including                            collection of specimen(s) by brushing or washing,                            when performed (separate procedure) Diagnosis Code(s):        --- Professional ---                           Z80.0, Family history of malignant neoplasm of                            digestive organs                           Z86.010, Personal history of colonic polyps CPT copyright 2016 American Medical Association. All rights reserved. The codes documented in this report are preliminary and upon coder review may  be revised to meet current compliance requirements. Earle Gell, MD Garlan Fair, MD 02/09/2016 2:46:48 PM This report has been signed electronically. Number of Addenda: 0

## 2016-02-09 NOTE — Op Note (Signed)
[facility] Patient Name: [name] Procedure Date: 02/09/2016 MRN: [id number] Attending MD: Garlan Fair , MD Date of Birth: 28-Feb-1966 CSN: [id number] Age: 50 Admit Type: Outpatient Procedure:                Upper GI endoscopy Indications:              Regurgitation and Lynch Syndrome MLH1 mutation Providers:                Garlan Fair, MD, Hilma Favors, RN, Alfonso Patten, Technician, Enrigue Catena, CRNA Referring MD:              Medicines:                Propofol per Anesthesia Complications:            No immediate complications. Estimated Blood Loss:     Estimated blood loss: none. Procedure:                Pre-Anesthesia Assessment:                           - Prior to the procedure, a History and Physical                            was performed, and patient medications and                            allergies were reviewed. The patient's tolerance of                            previous anesthesia was also reviewed. The risks                            and benefits of the procedure and the sedation                            options and risks were discussed with the patient.                            All questions were answered, and informed consent                            was obtained. Prior Anticoagulants: The patient has                            taken no previous anticoagulant or antiplatelet                            agents. ASA Grade Assessment: II - A patient with                            mild systemic disease. After reviewing the risks  and benefits, the patient was deemed in                            satisfactory condition to undergo the procedure.                           After obtaining informed consent, the endoscope was                            passed under direct vision. Throughout the                            procedure, the patient's blood pressure, pulse, and                  oxygen saturations were monitored continuously. The                            Endoscope was introduced through the mouth, and                            advanced to the third part of duodenum. The upper                            GI endoscopy was accomplished without difficulty.                            The patient tolerated the procedure well. Scope In: Scope Out: Findings:      The Z-line was regular and was found 37 cm from the incisors.      The examined esophagus was normal.      The entire examined stomach was normal.      The examined duodenum was normal. Impression:               - Z-line regular, 37 cm from the incisors.                           - Normal esophagus.                           - Normal stomach.                           - Normal examined duodenum.                           - No specimens collected. Moderate Sedation:      N/A- Per Anesthesia Care Recommendation:           - Patient has a contact number available for                            emergencies. The signs and symptoms of potential                            delayed complications were discussed with the  patient. Return to normal activities tomorrow.                            Written discharge instructions were provided to the                            patient.                           - Repeat upper endoscopy in 2 years for                            surveillance.                           - Resume previous diet.                           - Continue present medications. Procedure Code(s):        --- Professional ---                           [phone], Esophagogastroduodenoscopy, flexible,                            transoral; diagnostic, including collection of                            specimen(s) by brushing or washing, when performed                            (separate procedure) Diagnosis Code(s):        --- Professional ---                            R11.10, Vomiting, unspecified CPT copyright 2016 American Medical Association. All rights reserved. The codes documented in this report are preliminary and upon coder review may  be revised to meet current compliance requirements. Earle Gell, MD Garlan Fair, MD 02/09/2016 2:51:12 PM This report has been signed electronically. Number of Addenda: 0

## 2016-02-09 NOTE — Discharge Instructions (Signed)

## 2016-02-09 NOTE — H&P (Unsigned)
[name], [name]:  [id number]  MEDICAL RECORD NO.:  [id number]  LOCATION:                                 FACILITY:  PHYSICIAN:  [name], M.D.   DATE OF BIRTH:  [birth year]  DATE OF ADMISSION: DATE OF DISCHARGE:                             HISTORY & PHYSICAL   DATE OF SURGERY:  August 8th at [facility] in [place].  HISTORY OF PRESENT ILLNESS:  The patient is a 50 year old gravida 107, para 4 divorced female, presents for laparoscopic-assisted vaginal hysterectomy with removal of both tubes and ovaries and cystoscopy.  The patient had a family history that was concerning.  Father had a history of colon cancer.  Sister with a history of ovarian cancer which she died from.  Paternal grandmother with breast cancer, and maternal grandmother with uterine cancer.  We did test her for possible genetic link since she did test positive for Lynch syndrome, MLH1 mutation.  This does put her at high risk of both uterine and ovarian cancer, and she is admitted at the present time to undergo laparoscopic-assisted vaginal hysterectomy with removal of both tubes and ovaries.  ALLERGIES:  She has no known drug allergies.  MEDICATIONS:  She is on an anti-reflux medication.  PAST MEDICAL HISTORY:  She did have a history of alcohol use, had been hospitalized with liver disease in the past, also has had previous ankle surgery.  Otherwise, usual childhood diseases, no sequelae.  SOCIAL HISTORY:  Reveals no tobacco or alcohol use at the present time.  FAMILY HISTORY:  Noncontributory.  REVIEW OF SYSTEMS:  Noncontributory.  PHYSICAL EXAMINATION:  VITAL SIGNS:  The patient is afebrile.  Stable vital signs. HEENT:  The patient is normocephalic.  Pupils are equal, round, and reactive to light and accommodation.  Extraocular movements were intact. Sclerae and conjunctivae were clear.  Oropharynx clear. NECK:  No thyromegaly. BREASTS:  No discrete  masses. LUNGS:  Clear. CARDIOVASCULAR:  Regular rhythm and rate without murmurs or gallops.  No carotid or abdominal bruits. ABDOMEN:  Benign.  No mass, organomegaly, or tenderness. PELVIC:  Normal external genitalia.  Vaginal cuff is clear.  Cervix unremarkable, usual in size, shape, and contour.  Adnexa free of mass or tenderness. EXTREMITIES:  Trace edema. NEUROLOGIC:  Grossly within normal limits.  IMPRESSION:  The patient with Lynch syndrome mutation.  PLAN:  The patient is to undergo laparoscopic-assisted vaginal hysterectomy, bilateral salpingo-oophorectomy.  The nature of the procedure have been discussed.  The risks were explained including the risk of infection, risk of hemorrhage that could require transfusion with risk of AIDS or hepatitis, risk of injury to adjacent organs such as bladder, bowel, ureters that could require further exploratory surgery, risk of deep venous thrombosis and pulmonary embolus.  The patient expressed understanding of indications and risks.     [name], M.D.     JSM/MEDQ  D:  02/09/2016  T:  02/09/2016  Job:  [id number]

## 2016-02-09 NOTE — Anesthesia Preprocedure Evaluation (Addendum)
Anesthesia Evaluation  Patient identified by MRN, date of birth, ID band Patient awake    Reviewed: Allergy & Precautions, H&P , NPO status , Patient's Chart, lab work & pertinent test results  Airway Mallampati: II  TM Distance: >3 FB Neck ROM: Full    Dental no notable dental hx. (+) Teeth Intact, Dental Advisory Given   Pulmonary neg pulmonary ROS,    Pulmonary exam normal        Cardiovascular negative cardio ROS   Rhythm:Regular Rate:Normal     Neuro/Psych negative neurological ROS  negative psych ROS   GI/Hepatic negative GI ROS, Neg liver ROS,   Endo/Other  negative endocrine ROS  Renal/GU negative Renal ROS  negative genitourinary   Musculoskeletal   Abdominal   Peds  Hematology negative hematology ROS (+)   Anesthesia Other Findings   Reproductive/Obstetrics negative OB ROS                            Anesthesia Physical Anesthesia Plan  ASA: I  Anesthesia Plan: MAC   Post-op Pain Management:    Induction: Intravenous  Airway Management Planned: Nasal Cannula  Additional Equipment:   Intra-op Plan:   Post-operative Plan:   Informed Consent: I have reviewed the patients History and Physical, chart, labs and discussed the procedure including the risks, benefits and alternatives for the proposed anesthesia with the patient or authorized representative who has indicated his/her understanding and acceptance.   Dental advisory given  Plan Discussed with: CRNA  Anesthesia Plan Comments:         Anesthesia Quick Evaluation

## 2016-02-10 ENCOUNTER — Encounter (HOSPITAL_COMMUNITY): Payer: Self-pay | Admitting: Gastroenterology

## 2016-02-11 ENCOUNTER — Other Ambulatory Visit (HOSPITAL_COMMUNITY): Payer: Self-pay | Admitting: *Deleted

## 2016-02-12 ENCOUNTER — Other Ambulatory Visit (HOSPITAL_COMMUNITY): Payer: Medicare Other

## 2016-02-12 NOTE — Patient Instructions (Addendum)
Your procedure is scheduled on: Tuesday, 8/8  Enter through the Main Entrance at : [employer] up desk phone and dial [phone] and inform us of your arrival.  Please call [phone] if you have any problems the morning of surgery.  Remember:  Do not eat food or drink liquids, including water, after midnight:Monday 02/23/16  You may brush your teeth the morning of surgery.  Take these meds the morning of surgery with a sip of water:  Nexium   DO NOT wear jewelry, eye make-up, lipstick,body lotion, or dark fingernail polish.  (Polished toes are ok)  You may wear deodorant.  If you are to be admitted after surgery, leave suitcase in car until your room has been assigned. Patients discharged on the day of surgery will not be allowed to drive home. Wear loose fitting, comfortable clothes for your ride home.  Home with mother [name] cell [phone] or husband [name] cell [phone]

## 2016-02-13 ENCOUNTER — Encounter (HOSPITAL_COMMUNITY): Payer: Self-pay

## 2016-02-13 ENCOUNTER — Encounter (HOSPITAL_COMMUNITY)
Admission: RE | Admit: 2016-02-13 | Discharge: 2016-02-13 | Disposition: A | Discharge status: Home / Self Care | Payer: Medicare Other | Source: Ambulatory Visit | Attending: Obstetrics and Gynecology | Admitting: Obstetrics and Gynecology

## 2016-02-13 DIAGNOSIS — Z01812 Encounter for preprocedural laboratory examination: Secondary | ICD-10-CM | POA: Insufficient documentation

## 2016-02-13 DIAGNOSIS — Z01810 Encounter for preprocedural cardiovascular examination: Secondary | ICD-10-CM | POA: Insufficient documentation

## 2016-02-13 HISTORY — DX: Genetic susceptibility to other malignant neoplasm: Z15.09

## 2016-02-13 HISTORY — DX: Unspecified convulsions: R56.9

## 2016-02-13 HISTORY — DX: Gastro-esophageal reflux disease without esophagitis: K21.9

## 2016-02-13 HISTORY — DX: Major depressive disorder, single episode, unspecified: F32.9

## 2016-02-13 HISTORY — DX: Depression, unspecified: F32.A

## 2016-02-13 LAB — COMPREHENSIVE METABOLIC PANEL
ALBUMIN: 4.1 g/dL (ref 3.5–5.0)
ALT: 12 U/L — AB (ref 14–54)
AST: 16 U/L (ref 15–41)
Alkaline Phosphatase: 61 U/L (ref 38–126)
Anion gap: 7 (ref 5–15)
BILIRUBIN TOTAL: 0.6 mg/dL (ref 0.3–1.2)
BUN: 13 mg/dL (ref 6–20)
CHLORIDE: 105 mmol/L (ref 101–111)
CO2: 26 mmol/L (ref 22–32)
CREATININE: 0.85 mg/dL (ref 0.44–1.00)
Calcium: 9.4 mg/dL (ref 8.9–10.3)
GFR calc Af Amer: >60 mL/min (ref >60)
GLUCOSE: 85 mg/dL (ref 65–99)
Potassium: 3.7 mmol/L (ref 3.5–5.1)
Sodium: 138 mmol/L (ref 135–145)
Total Protein: 7.7 g/dL (ref 6.5–8.1)

## 2016-02-13 LAB — CBC
HEMATOCRIT: 37.5 % (ref 36.0–46.0)
Hemoglobin: 12.5 g/dL (ref 12.0–15.0)
MCH: 29.1 pg (ref 26.0–34.0)
MCHC: 33.3 g/dL (ref 30.0–36.0)
MCV: 87.4 fL (ref 78.0–100.0)
PLATELETS: 266 10*3/uL (ref 150–400)
RBC: 4.29 MIL/uL (ref 3.87–5.11)
RDW: 12.4 % (ref 11.5–15.5)
WBC: 5.3 10*3/uL (ref 4.0–10.5)

## 2016-03-08 ENCOUNTER — Other Ambulatory Visit: Payer: Medicare Other

## 2016-03-28 DIAGNOSIS — Z1509 Genetic susceptibility to other malignant neoplasm: Secondary | ICD-10-CM | POA: Insufficient documentation

## 2016-03-28 DIAGNOSIS — Z1379 Encounter for other screening for genetic and chromosomal anomalies: Secondary | ICD-10-CM | POA: Insufficient documentation

## 2016-03-28 NOTE — Progress Notes (Signed)
GENETIC TEST RESULTS   Patient Name: [name] Patient Age: 50 y.o. Encounter Date: 12/31/2015  Referring Provider: Arvella Nigh, MD   Ms. [name] was seen in the [place] clinic due to a strong family history of colon cancer, as well as a family history of uterine, ovarian,breast, and other cancers and concern regarding a hereditary predisposition to cancer in the family. Please refer to the prior [facility] note from October 16, 2015 for more information regarding her medical and family histories and our assessment at the time.   GENETIC TESTING: At the time of Ms. [name]'s visit on 10/16/15, we recommended she pursue genetic testing of the 32-gene Comprehensive Cancer Panel with MSH2 Exons 1-7 Inversion Analysis through [facility] Hope Pigeon, MD).  This test included sequencing and/or deletion/duplication analysis of the following genes: APC, ATM, AXIN2, BARD1, BMPR1A, BRCA1, BRCA2, BRIP1, CDH1, CDK4, CDKN2A, CHEK2, EPCAM, FANCC, MLH1, MSH2, MSH6, MUTYH, NBN, PALB2, PMS2, POLD1, POLE, PTEN, RAD51C, RAD51D, SCG5/GREM1, SMAD4, STK11, TP53, VHL, and XRCC2. Testing was positive and showed that Ms. 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 This mutation causes Lynch syndrome, or Hereditary Non-polyposis Colorectal Cancer (HNPCC) syndrome.  [facility] SYNDROME: This syndrome increases the chance for mainly colon, uterine, stomach, and ovarian cancers, but other cancers as well.  Families with Lynch syndrome tend to have multiple family members with these cancers, typically diagnosed under age 1, and diagnoses in multiple generations.     The Lynch syndrome genes are called mismatch repair genes because they produce proteins that help to fix mistakes made during DNA replication.  Changes, or mutations, in any of these genes can affect the protein product, thereby, resulting in increased mistakes in the DNA of cells.  Errors in these  cells continue to build up and this increases the risk for tumors of the colon, uterus, or other part of the body.   More specific MLH1 mutation-related cancer risks are presented below:   CANCER TYPE  % CANCER RISK (to age 7)  % RISK FOR GENERAL POPULATION (to age 73)                                               Colorectal  52-82  4.5   Endometrial/Uterine  25-60  2.7   Ovarian  4-20  1.6   Gastric  6-13  <1.0   Small Bowel  3-6  <1.0   Urinary Tract  1-7  <1.0   Pancreatic  1-6  <1.0   Hepatobiliary Tract  1-4  <1.0   Brain/Central Nervous System  1-3  <1.0   Skin (Sebaceous Neoplasms)  1-9  <1.0   Risk for 2nd LS cancer after diagnosis of 1st cancer  Increased risk  N/A     SURVEILLANCE: Because of the high risk for colorectal cancer with Lynch syndrome, we recommend Ms. [name] follow the screening recommendations outlined below.  These recommendations are published by the [employer] (NCCN) Version 1.2017 (most recent as of 01/07/2016).   CANCER TYPE  PROCEDURE  AGE TO BEGIN  Frequency  Colorectal  Colonoscopy  20-25 or 2-5 years younger than the earliest diagnosis in the family if it is under age 79  Every 1-2 years     Colorectal surgical evaluation may be appropriate for some patients  Individualized  Individualized     Consider the use of aspirin as a risk-reduction agent.  Individualized  Individualized   Endometrial/Uterine  Patient education about endometrial cancer symptoms  Individualized  N/A     Consider pelvic examination, endometrial sampling, and transvaginal ultrasound  30 to 35 years  Annually     Consider hysterectomy  After completion of childbearing  N/A   Ovarian  Consider bilateral salpingo-oophorectomy  Age 73 or after completion of childbearing  N/A     Consider transvaginal ultrasound and CA-125 measurement  30 to 35 years  N/A   Gastric and Small Bowel   Consider upper endoscopy, particularly for patients with additional risk factors for gastric/small bowel cancers, such as family history and/or Asian ancestry.  Consider biopsy of the antrum  30 to 35 years  Every 2 to 5 years   Urinary Tract  Consider urinalysis  30 to 35 years  Annually   Pancreatic  Consider available options for pancreatic cancer screening, including the possibility of endoscopic ultrasonography (EUS) and MRI/magnetic resonance cholangiopancreat- ography (MRCP).  It is recommended that patients who are candidates for pancreatic cancer screening be managed by a multidisciplinary team w/ experience in screening for pancreatic cancer, preferably w/ research protocols.  Individualized  N/A   Hepatobiliary Tract  Currently there are no specific medical management guidelines.  N/A  N/A   Central Nervous System  Consider physical/neurological examination.  25 to 30 years  Annually   Skin (Sebaceous Neoplasms  Currently there are no specific medical management guidelines.  N/A  N/A   Breast  Some studies have suggested an increased risk for breast cancer.  Currently there is not enough evidence to support additional screening.  N/A  N/A     Due to Ms. [name]'s high risk for colon cancer, we discussed that it is very important that she now have colonoscopies every 1-2 years.  She should also consider a surgery to remove her uterus, ovaries, and fallopian tubes (total abdominal hysterectomy with bilateral salpingo-oophorectomy) to reduce her risk of uterine and ovarian cancers.  She should discuss these cancer risks with her doctor and should develop an appropriate plan to best manage the additional cancer risks listed above.    FAMILY MEMBERS: Each of Ms. [name]'s children have a 50% chance of having inherited this mutation.  Three of her children are over the age of 50, and, so are eligible to have genetic testing now. Her youngest daughter will be eligible to have genetic testing once she  turns 22.  We do not typically see Lynch syndrome-related cancers in childhood, and colonoscopy screening does not generally begin until age 39-25 for individuals who test positive for Lynch syndrome.     Each of Ms. [name]'s siblings has a 50% of having inherited this same mutation. Due to the extensive colon cancer history on Ms. Nogueira's father's side of the family and for her father personally, it is most likely that she inherited this mutation from her father.  However, we discussed that we can offer testing to her mother just to verify that she and the maternal relatives are not at-risk for having this mutation.  Ms. Meckes should inform all of her relatives--especially the paternal relatives--of this positive test result.  At-risk  relatives should have site-specific genetic testing to determine whether or not they have inherited the mutation. We will be happy to meet with any relatives in our clinic or refer them to a cancer genetic counselor in their local area. To locate genetic counselors in other cities, family members can visit the website of the [employer] ([email or web address]) and Secretary/administrator for a Oncologist by zip code.   Children with two parents who both have MLH1 gene mutations are at a 25% risk for inheriting both mutations, resulting in a condition called Constitutional Mismatch Repair Deficiency Syndrome.  This condition can result in earlier cancers, potentially those seen in childhood.  A scenario in which both parents have pathogenic MLH1 mutations would be very rare, however, and typically the family history will indicate whether or not genetic testing is warranted for someone's partner.   FOLLOW-UP: We encouraged Ms. [name] to meet with [place] in-person, so that we can review these risks and recommendations and so that we can answer any questions that she and her family members may have.  To schedule a follow-up appointment, she can call us at [phone].  We  also encouraged Ms. Barnfield to remain in contact with [place] on an annual basis so we can update her personal and family histories, and so that we can let her know of advances in cancer genetics that may benefit her and her family. Ms. Mcveigh is welcome to call us at anytime with any questions or if she would like any additional educational or support resources regarding Lynch syndrome.  Ms. Cislo has been mailed a copy of her test result, as well as a results letter and a letter to share with her at-risk family members.     Jeanine Luz, [place], [place] Certified Genetic Counselor Phone: [phone] [email or web address]

## 2016-04-14 NOTE — H&P (Signed)
Patient name [name], [name] DICTATION# [id number] CSN# [id number]  [name], MD 04/14/2016 7:16 AM

## 2016-04-15 NOTE — H&P (Signed)
[name], SKORUPSKI NO.:  [id number]  MEDICAL RECORD NO.:  [id number]  LOCATION:                                 FACILITY:  PHYSICIAN:  [name], M.D.   DATE OF BIRTH:  10-23-1965  DATE OF ADMISSION: DATE OF DISCHARGE:                             HISTORY & PHYSICAL   DATE OF SURGERY:  May 03, 2016, at [facility] here in [place].  HISTORY OF PRESENT ILLNESS:  The patient is a 50 year old, gravida 24, para 4, divorced female who presents for laparoscopic-assisted vaginal hysterectomy with bilateral salpingo-oophorectomy.  In relation to the present admission, the patient has a significant family history in terms of cancer.  It is noted that her sister had a history of ovarian cancer, paternal grandmother with breast cancer, and maternal grandmother with uterine cancer.  Because of this, she underwent evaluation for genetic disorders through the geneticist.  It turns out that she was positive for Lynch mutation, which was MLH1 gene mutation.  It was recommended that she have uterus, tubes, and ovaries removed, for which she comes in at the present time.  ALLERGIES:  She has no known drug allergies.  MEDICATIONS:  None.  PAST MEDICAL HISTORY:  She has had a history of liver disease, but recent liver function tests were normal.  She has had previous ankle surgery.  She has also had a previous bilateral tubal ligation.  Family history is significant for the above-noted cancers.  SOCIAL HISTORY:  Reveals no tobacco or alcohol use.  REVIEW OF SYSTEMS:  Noncontributory.  PHYSICAL EXAMINATION:  VITAL SIGNS:  The patient is afebrile.  Stable vital signs. HEENT:  The patient is normocephalic.  Pupils equal, round, reactive to light and accommodation.  Extraocular are intact.  Sclerae and conjunctivae are clear.  Oropharynx clear. NECK:  Without thyromegaly. BREASTS:  No masses noted. LUNGS:  Clear. CARDIOVASCULAR:  Regular rhythm and rate  without murmurs or gallops. ABDOMEN:  Benign.  No mass, organomegaly, or tenderness. PELVIC:  Normal external genitalia.  Vaginal mucosa is clear.  Cervix unremarkable.  Uterus normal size, shape, and contour.  Adnexa free of masses or tenderness. EXTREMITIES:  Trace edema. NEUROLOGIC:  Grossly within normal limits.  IMPRESSION:  Lynch mutation with increased risk of uterine or ovarian cancer for definitive surgery.  PLAN:  The risks of surgery have been discussed including the risk of infection. The risk of hemorrhage is described, this can lead to the need for transfusion with risk of AIDS or hepatitis.  There is a risk of injury to adjacent organs such as bladder, bowel, or ureters that could require further exploratory surgery.  Risk of deep venous thrombosis and pulmonary embolus.  The patient expressed understanding of potential indications and risks.     [name], M.D.     JSM/MEDQ  D:  04/14/2016  T:  04/15/2016  Job:  [id number]

## 2016-04-16 NOTE — Patient Instructions (Signed)
Your procedure is scheduled on:  Monday, Oct. 16, 2017  Enter through the [employer] [facility] at:  6:00 AM  Pick up the phone at the desk and dial [phone].  Call this number if you have problems the morning of surgery: [phone].  Remember: Do NOT eat food or drink after:  Midnight Sunday, Oct. 15, 2017  Take these medicines the morning of surgery with a SIP OF WATER:  Nexium  Do NOT wear jewelry (body piercing), metal hair clips/bobby pins, make-up, or nail polish. Do NOT wear lotions, powders, or perfumes.  You may wear deodorant. Do NOT shave for 48 hours prior to surgery. Do NOT bring valuables to the hospital. Contacts, dentures, or bridgework may not be worn into surgery.  Leave suitcase in car.  After surgery it may be brought to your room.  For patients admitted to the hospital, checkout time is 11:00 AM the day of discharge.

## 2016-04-19 ENCOUNTER — Inpatient Hospital Stay (HOSPITAL_COMMUNITY)
Admission: RE | Admit: 2016-04-19 | Discharge: 2016-04-19 | Disposition: A | Discharge status: Home / Self Care | Payer: Medicare Other | Source: Ambulatory Visit

## 2016-04-26 ENCOUNTER — Inpatient Hospital Stay (HOSPITAL_COMMUNITY): Admission: RE | Admit: 2016-04-26 | Payer: Medicare Other | Source: Ambulatory Visit

## 2016-05-11 ENCOUNTER — Telehealth: Payer: Self-pay | Admitting: *Deleted

## 2016-05-11 NOTE — Telephone Encounter (Signed)
"  I need to know my Blood type.  I had genetic testing done."    Explained lab orders for Genetic testing do not include blood type which is only needed for transfusion.  No type and Hold or screen completed in EPIC.  advised she try Sickle cell, [employer] Cross, BioLIfe Plasma or CSL Plasma since she reports donating plasma.  No further questions.

## 2016-06-17 ENCOUNTER — Encounter (HOSPITAL_COMMUNITY): Payer: Self-pay | Admitting: Oncology

## 2016-06-17 ENCOUNTER — Encounter (HOSPITAL_COMMUNITY): Payer: Self-pay

## 2016-06-17 ENCOUNTER — Inpatient Hospital Stay (HOSPITAL_COMMUNITY)
Admission: AD | Admit: 2016-06-17 | Discharge: 2016-06-21 | DRG: 885 | Disposition: A | Discharge status: Home / Self Care | Payer: Medicare Other | Source: Intra-hospital | Attending: Psychiatry | Admitting: Psychiatry

## 2016-06-17 ENCOUNTER — Emergency Department (HOSPITAL_COMMUNITY): Payer: Medicare Other

## 2016-06-17 ENCOUNTER — Emergency Department (HOSPITAL_COMMUNITY)
Admission: EM | Admit: 2016-06-17 | Discharge: 2016-06-17 | Disposition: A | Discharge status: Home / Self Care | Payer: Medicare Other | Attending: Emergency Medicine | Admitting: Emergency Medicine

## 2016-06-17 DIAGNOSIS — Z23 Encounter for immunization: Secondary | ICD-10-CM

## 2016-06-17 DIAGNOSIS — Z9889 Other specified postprocedural states: Secondary | ICD-10-CM | POA: Diagnosis not present

## 2016-06-17 DIAGNOSIS — Z1509 Genetic susceptibility to other malignant neoplasm: Secondary | ICD-10-CM | POA: Diagnosis not present

## 2016-06-17 DIAGNOSIS — Z803 Family history of malignant neoplasm of breast: Secondary | ICD-10-CM | POA: Diagnosis not present

## 2016-06-17 DIAGNOSIS — F323 Major depressive disorder, single episode, severe with psychotic features: Secondary | ICD-10-CM | POA: Diagnosis present

## 2016-06-17 DIAGNOSIS — F1020 Alcohol dependence, uncomplicated: Secondary | ICD-10-CM | POA: Diagnosis not present

## 2016-06-17 DIAGNOSIS — Z8041 Family history of malignant neoplasm of ovary: Secondary | ICD-10-CM | POA: Diagnosis not present

## 2016-06-17 DIAGNOSIS — F333 Major depressive disorder, recurrent, severe with psychotic symptoms: Secondary | ICD-10-CM | POA: Diagnosis not present

## 2016-06-17 DIAGNOSIS — Z981 Arthrodesis status: Secondary | ICD-10-CM

## 2016-06-17 DIAGNOSIS — F419 Anxiety disorder, unspecified: Secondary | ICD-10-CM | POA: Diagnosis present

## 2016-06-17 DIAGNOSIS — K219 Gastro-esophageal reflux disease without esophagitis: Secondary | ICD-10-CM | POA: Diagnosis present

## 2016-06-17 DIAGNOSIS — Z79899 Other long term (current) drug therapy: Secondary | ICD-10-CM | POA: Insufficient documentation

## 2016-06-17 DIAGNOSIS — G4700 Insomnia, unspecified: Secondary | ICD-10-CM | POA: Diagnosis present

## 2016-06-17 DIAGNOSIS — Z800 Family history of malignant neoplasm of digestive organs: Secondary | ICD-10-CM

## 2016-06-17 DIAGNOSIS — F1010 Alcohol abuse, uncomplicated: Secondary | ICD-10-CM | POA: Diagnosis present

## 2016-06-17 DIAGNOSIS — R440 Auditory hallucinations: Secondary | ICD-10-CM | POA: Diagnosis not present

## 2016-06-17 LAB — RAPID URINE DRUG SCREEN, HOSP PERFORMED
AMPHETAMINES: NOT DETECTED
BENZODIAZEPINES: NOT DETECTED
Barbiturates: NOT DETECTED
Cocaine: NOT DETECTED
OPIATES: NOT DETECTED
TETRAHYDROCANNABINOL: NOT DETECTED

## 2016-06-17 LAB — COMPREHENSIVE METABOLIC PANEL
ALT: 48 U/L (ref 14–54)
ANION GAP: 8 (ref 5–15)
AST: 69 U/L — ABNORMAL HIGH (ref 15–41)
Albumin: 4.5 g/dL (ref 3.5–5.0)
Alkaline Phosphatase: 58 U/L (ref 38–126)
BUN: <5 mg/dL — ABNORMAL LOW (ref 6–20)
CHLORIDE: 102 mmol/L (ref 101–111)
CO2: 26 mmol/L (ref 22–32)
Calcium: 9.5 mg/dL (ref 8.9–10.3)
Creatinine, Ser: 0.6 mg/dL (ref 0.44–1.00)
GFR calc non Af Amer: >60 mL/min (ref >60)
Glucose, Bld: 113 mg/dL — ABNORMAL HIGH (ref 65–99)
Potassium: 3.7 mmol/L (ref 3.5–5.1)
SODIUM: 136 mmol/L (ref 135–145)
Total Bilirubin: 1 mg/dL (ref 0.3–1.2)
Total Protein: 8.2 g/dL — ABNORMAL HIGH (ref 6.5–8.1)

## 2016-06-17 LAB — CBC WITH DIFFERENTIAL/PLATELET
Basophils Absolute: 0 10*3/uL (ref 0.0–0.1)
Basophils Relative: 1 %
EOS ABS: 0 10*3/uL (ref 0.0–0.7)
Eosinophils Relative: 1 %
HCT: 38.7 % (ref 36.0–46.0)
HEMOGLOBIN: 12.5 g/dL (ref 12.0–15.0)
LYMPHS ABS: 1.2 10*3/uL (ref 0.7–4.0)
LYMPHS PCT: 23 %
MCH: 29.7 pg (ref 26.0–34.0)
MCHC: 32.3 g/dL (ref 30.0–36.0)
MCV: 91.9 fL (ref 78.0–100.0)
Monocytes Absolute: 0.9 10*3/uL (ref 0.1–1.0)
Monocytes Relative: 17 %
NEUTROS PCT: 60 %
Neutro Abs: 3.3 10*3/uL (ref 1.7–7.7)
Platelets: 147 10*3/uL — ABNORMAL LOW (ref 150–400)
RBC: 4.21 MIL/uL (ref 3.87–5.11)
RDW: 15.9 % — ABNORMAL HIGH (ref 11.5–15.5)
WBC: 5.5 10*3/uL (ref 4.0–10.5)

## 2016-06-17 LAB — ETHANOL: Alcohol, Ethyl (B): <5 mg/dL (ref <5)

## 2016-06-17 LAB — ACETAMINOPHEN LEVEL: Acetaminophen (Tylenol), Serum: <10 ug/mL — ABNORMAL LOW (ref 10–30)

## 2016-06-17 MED ORDER — LORAZEPAM 1 MG PO TABS
1.0000 mg | ORAL_TABLET | Freq: Four times a day (QID) | ORAL | Status: AC | PRN
  Administered 2016-06-19: 1 mg via ORAL
  Filled 2016-06-17: qty 1

## 2016-06-17 MED ORDER — ONDANSETRON 4 MG PO TBDP: 4.0000 mg | ORAL_TABLET | Freq: Four times a day (QID) | ORAL | Status: AC | PRN

## 2016-06-17 MED ORDER — FLUOXETINE HCL 10 MG PO CAPS
10.0000 mg | ORAL_CAPSULE | Freq: Every day | ORAL | Status: DC
  Administered 2016-06-17: 10 mg via ORAL
  Filled 2016-06-17: qty 1

## 2016-06-17 MED ORDER — HYDROXYZINE HCL 25 MG PO TABS
25.0000 mg | ORAL_TABLET | Freq: Four times a day (QID) | ORAL | Status: AC | PRN
  Administered 2016-06-19: 25 mg via ORAL
  Filled 2016-06-17: qty 1

## 2016-06-17 MED ORDER — LORAZEPAM 1 MG PO TABS
1.0000 mg | ORAL_TABLET | Freq: Four times a day (QID) | ORAL | Status: AC
  Administered 2016-06-17 – 2016-06-19 (×6): 1 mg via ORAL
  Filled 2016-06-17 (×6): qty 1

## 2016-06-17 MED ORDER — LORAZEPAM 1 MG PO TABS: 1.0000 mg | ORAL_TABLET | Freq: Every day | ORAL | Status: DC

## 2016-06-17 MED ORDER — FLUOXETINE HCL 10 MG PO CAPS
10.0000 mg | ORAL_CAPSULE | Freq: Every day | ORAL | Status: DC
  Administered 2016-06-18: 10 mg via ORAL
  Filled 2016-06-17 (×3): qty 1

## 2016-06-17 MED ORDER — ALUM & MAG HYDROXIDE-SIMETH 200-200-20 MG/5ML PO SUSP
30.0000 mL | ORAL | Status: DC | PRN
  Administered 2016-06-17 – 2016-06-19 (×2): 30 mL via ORAL
  Filled 2016-06-17 (×2): qty 30

## 2016-06-17 MED ORDER — ACETAMINOPHEN 325 MG PO TABS: 650.0000 mg | ORAL_TABLET | Freq: Four times a day (QID) | ORAL | Status: DC | PRN

## 2016-06-17 MED ORDER — VITAMIN B-1 100 MG PO TABS
100.0000 mg | ORAL_TABLET | Freq: Every day | ORAL | Status: DC
  Administered 2016-06-18 – 2016-06-21 (×4): 100 mg via ORAL
  Filled 2016-06-17 (×7): qty 1

## 2016-06-17 MED ORDER — INFLUENZA VAC SPLIT QUAD 0.5 ML IM SUSY
0.5000 mL | PREFILLED_SYRINGE | INTRAMUSCULAR | Status: AC
  Administered 2016-06-18: 0.5 mL via INTRAMUSCULAR
  Filled 2016-06-17: qty 0.5

## 2016-06-17 MED ORDER — RISPERIDONE 0.5 MG PO TABS: 0.2500 mg | ORAL_TABLET | Freq: Every day | ORAL | Status: DC

## 2016-06-17 MED ORDER — RISPERIDONE 0.25 MG PO TABS
0.2500 mg | ORAL_TABLET | Freq: Every day | ORAL | Status: DC
  Administered 2016-06-17: 0.25 mg via ORAL
  Filled 2016-06-17 (×2): qty 1

## 2016-06-17 MED ORDER — LOPERAMIDE HCL 2 MG PO CAPS: 2.0000 mg | ORAL_CAPSULE | ORAL | Status: AC | PRN

## 2016-06-17 MED ORDER — HYDROXYZINE HCL 25 MG PO TABS
25.0000 mg | ORAL_TABLET | Freq: Three times a day (TID) | ORAL | Status: DC | PRN
  Administered 2016-06-17: 25 mg via ORAL
  Filled 2016-06-17: qty 1

## 2016-06-17 MED ORDER — HYDROXYZINE HCL 25 MG PO TABS
25.0000 mg | ORAL_TABLET | Freq: Three times a day (TID) | ORAL | Status: DC | PRN
Start: 2016-06-17 — End: 2016-06-17
  Administered 2016-06-17: 25 mg via ORAL
  Filled 2016-06-17: qty 1

## 2016-06-17 MED ORDER — MAGNESIUM HYDROXIDE 400 MG/5ML PO SUSP
30.0000 mL | Freq: Every day | ORAL | Status: DC | PRN
  Administered 2016-06-17 – 2016-06-20 (×2): 30 mL via ORAL
  Filled 2016-06-17 (×2): qty 30

## 2016-06-17 MED ORDER — ADULT MULTIVITAMIN W/MINERALS CH
1.0000 | ORAL_TABLET | Freq: Every day | ORAL | Status: DC
  Administered 2016-06-18 – 2016-06-21 (×4): 1 via ORAL
  Filled 2016-06-17 (×7): qty 1

## 2016-06-17 MED ORDER — LORAZEPAM 1 MG PO TABS
1.0000 mg | ORAL_TABLET | Freq: Two times a day (BID) | ORAL | Status: AC
  Administered 2016-06-20 – 2016-06-21 (×2): 1 mg via ORAL
  Filled 2016-06-17 (×2): qty 1

## 2016-06-17 MED ORDER — LORAZEPAM 1 MG PO TABS
1.0000 mg | ORAL_TABLET | Freq: Three times a day (TID) | ORAL | Status: AC
  Administered 2016-06-19 – 2016-06-20 (×3): 1 mg via ORAL
  Filled 2016-06-17 (×3): qty 1

## 2016-06-17 NOTE — ED Notes (Signed)
Bed: [id number] Expected date:  Expected time:  Means of arrival:  Comments: RM 16

## 2016-06-17 NOTE — ED Notes (Signed)
TTS at bedside. 

## 2016-06-17 NOTE — ED Notes (Signed)
Per PA, Pt reports that she hears voices stating "you're a fool" in a melodic tune when she becomes agitated.  Pt's son has a history of schizophrenia.

## 2016-06-17 NOTE — Progress Notes (Signed)
[name] is a 50 year old female being admitted voluntarily to [facility] from [facility].  She came to the ED reporting hearing music in her right ear and it is the same song over and over.  She reported that the music has been interrupting her sleep and the ability to hear other things.  She stated that she has a lot of family stressors and that her family doesn't respect her.  She denies SI/HI and has no current mental health treatment.  She is diagnosed with Major Depressive Disorder, recurrent, moderate.  She reported that she is very upset with her family.  "They don't respect me and I am tired of it."  She stated that the song in her ear has been going on for 2 days and she believes that it is there to comfort her although it is bothersome.  She denies SI/HI or Visual hallucinations.  She denies any physical complaints at this time and appears to be in no physical distress.  Admission paperwork completed and signed.  Belongings searched and secured in locker # 56.  Skin assessment completed and noted surgery scar to right buttocks, right ankle and abdomen from reconstructive surgery due to fracture of right ankle.  No other skin issues noted.  Q 15 minute checks initiated for safety.  We will monitor the progress towards her goals.

## 2016-06-17 NOTE — ED Provider Notes (Signed)
[place] DEPT Provider Note   CSN: [id number] Arrival date & time: 06/17/16  7412     History   Chief Complaint Chief Complaint  Patient presents with  . Insomnia    HPI [name] is a 50 y.o. female.  HPI  50 y.o. female with a hx of Depression, presents to the Emergency Department today complaining of insomnia. Notes not being able to sleep for 3 years. Notes only able to sleep 2 or so hours a day. Has tried OTC medications with minimal relief. States that she can't sleep because she is hearing a song that is playing in her right ear that keeps saying "You a Fool" on repeat. Notes worsening with acute agitation and confrontation with roommates. No hx psychiatric disorder. Notes son with schizophrenia. No SI/HI. No active pain. No CP/SOB/ABD pain. No headaches. No vision changes. No fevers. No other symptoms noted.    Past Medical History:  Diagnosis Date  . Depression    no meds currently  . GERD (gastroesophageal reflux disease)   . MLH1-related Lynch syndrome (HNPCC2)   . Seizures ([place])    Only one back in 2004, none since, no meds, very stressed, patient just lost her husband  . SVD (spontaneous vaginal delivery)    x 4    Patient Active Problem List   Diagnosis Date Noted  . Genetic testing 03/28/2016  . MLH1-related Lynch syndrome (HNPCC2) 03/28/2016  . Family history of colon cancer 10/16/2015  . Family history of ovarian cancer 10/16/2015  . Family history of breast cancer in female 10/16/2015  . History of colonic polyps 10/16/2015    Past Surgical History:  Procedure Laterality Date  . ANKLE SURGERY Right 2001   skin graft and bone replacment  . CO2 LASER APPLICATION    . COLONOSCOPY WITH PROPOFOL N/A 02/09/2016   Procedure: COLONOSCOPY WITH PROPOFOL;  Surgeon: Garlan Fair, MD;  Location: [facility];  Service: Endoscopy;  Laterality: N/A;  . ESOPHAGOGASTRODUODENOSCOPY (EGD) WITH PROPOFOL N/A 02/09/2016   Procedure:  ESOPHAGOGASTRODUODENOSCOPY (EGD) WITH PROPOFOL;  Surgeon: Garlan Fair, MD;  Location: [facility];  Service: Endoscopy;  Laterality: N/A;  . right arm surgery     x 2   . TUBAL LIGATION    . WISDOM TOOTH EXTRACTION      OB History    No data available       Home Medications    Prior to Admission medications   Medication Sig Start Date End Date Taking? Authorizing Provider  esomeprazole (NEXIUM) 20 MG capsule Take 20 mg by mouth daily at 12 noon.    Historical Provider, MD    Family History Family History  Problem Relation Age of Onset  . Other Mother     hx of TAH-BSO in her late 20s-30 for unspecified cause  . Colon cancer Father     dx. early-mid-40s with later "recurrence"  . Ovarian cancer Sister 52  . Colon cancer Brother     dx. 58s; +surgery and chemotherapy  . Colon cancer Paternal Aunt 85  . Colon cancer Paternal Uncle 47  . Cancer Maternal Grandmother     NOS cancer, d. 59s  . Breast cancer Paternal Grandmother     d. early 105s  . Colon polyps Other     "many"; dx. late 61s or younger  . Other Daughter     breast issues--infection and fluid had to be removed; also has issues with on-going periods; dx. 15 or younger  . Other  Daughter     cyst on her fallopian tube, being monitored; dx. 41 or younger  . Other Brother     issues with recent illness; bloodwork to check for cancer - age 59  . Other Brother     issues with swollen prostate; GI issues; age 75  . Cancer Cousin 22    paternal 1st cousin dx. "bowel" cancer  . Colon cancer Cousin 74    paternal 1st cousin  . Colon cancer Cousin 39    paternal 1st cousin  . Colon cancer Cousin 32    paternal 1st cousin  . Colon cancer Cousin     paternal 1st cousin at unspecified age  . Colon cancer Cousin 52    daughter of affected paternal 1st cousin  . Uterine cancer Cousin     paternal 1st cousin, d. 93s  . Cancer Brother     paternal half-brother dx. in his 4s; NOS cancer  . Prostate cancer  Brother     paternal half-brother d. 51s    Social History Social History  Substance Use Topics  . Smoking status: Never Smoker  . Smokeless tobacco: Never Used  . Alcohol use Yes     Comment: hx of EtOH abuse; only drinks twice a yr now, but then "drinks heavy"     Allergies   Patient has no known allergies.   Review of Systems Review of Systems ROS reviewed and all are negative for acute change except as noted in the HPI.  Physical Exam Updated Vital Signs BP 161/84 (BP Location: Right Arm)   Pulse 99   Temp 97.7 F (36.5 C) (Oral)   Resp 20   LMP 12/30/2015 (Approximate)   SpO2 99%   Physical Exam  Constitutional: She is oriented to person, place, and time. Vital signs are normal. She appears well-developed and well-nourished.  HENT:  Head: Normocephalic.  Right Ear: Hearing normal.  Left Ear: Hearing normal.  Eyes: Conjunctivae and EOM are normal. Pupils are equal, round, and reactive to light.  Neck: Normal range of motion. Neck supple.  Cardiovascular: Normal rate, regular rhythm, normal heart sounds and intact distal pulses.   Pulmonary/Chest: Effort normal and breath sounds normal.  Neurological: She is alert and oriented to person, place, and time. She has normal strength. No cranial nerve deficit or sensory deficit.  Cranial Nerves:  II: Pupils equal, round, reactive to light III,IV, VI: ptosis not present, extra-ocular motions intact bilaterally  V,VII: smile symmetric, facial light touch sensation equal VIII: hearing grossly normal bilaterally  IX,X: midline uvula rise  XI: bilateral shoulder shrug equal and strong XII: midline tongue extension  Skin: Skin is warm and dry.  Psychiatric: She has a normal mood and affect. Her speech is normal and behavior is normal. Thought content normal. She expresses no homicidal and no suicidal ideation. She expresses no suicidal plans and no homicidal plans.  Nursing note and vitals reviewed.  ED Treatments /  Results  Labs (all labs ordered are listed, but only abnormal results are displayed) Labs Reviewed  COMPREHENSIVE METABOLIC PANEL - Abnormal; Notable for the following:       Result Value   Glucose, Bld 113 (*)    BUN <5 (*)    Total Protein 8.2 (*)    AST 69 (*)    All other components within normal limits  CBC WITH DIFFERENTIAL/PLATELET - Abnormal; Notable for the following:    RDW 15.9 (*)    Platelets 147 (*)  All other components within normal limits  ACETAMINOPHEN LEVEL - Abnormal; Notable for the following:    Acetaminophen (Tylenol), Serum <10 (*)    All other components within normal limits  ETHANOL  RAPID URINE DRUG SCREEN, HOSP PERFORMED   EKG  EKG Interpretation None       Radiology No results found.  Procedures Procedures (including critical care time)  Medications Ordered in ED Medications - No data to display   Initial Impression / Assessment and Plan / ED Course  I have reviewed the triage vital signs and the nursing notes.  Pertinent labs & imaging results that were available during my care of the patient were reviewed by me and considered in my medical decision making (see chart for details).  Clinical Course     Final Clinical Impressions(s) / ED Diagnoses  {I have reviewed and evaluated the relevant laboratory values.   {I have reviewed the relevant previous healthcare records.  {I obtained HPI from historian. {Patient discussed with supervising physician.  ED Course:  Assessment: Pt is a 50yF who presents with Insomnia x 3 years. Worsening last few weeks. Hearing voices. No SI/HI. FH with son of Schizophrenia. No psych history per patient. On exam, pt in NAD. Nontoxic/nonseptic appearing. VSS. Afebrile. Lungs CTA. Heart RRR. Abdomen nontender soft. CN evaluated and unremarkable. Labs pending. Consult TTs due to possible schizophrenia as pt hearing voices telling her "You a Fool" with increase during acute aggravation. Pt otherwise medically  cleared for TTS. Dispo pending recommendation   Disposition/Plan:  TTS Recommended Inpatient Treatment Pt acknowledges and agrees with plan  Supervising Physician Veryl Speak, MD  Final diagnoses:  Hearing voices    New Prescriptions New Prescriptions   No medications on file     Shary Decamp, PA-C 06/17/16 1021    Veryl Speak, MD 06/17/16 1950

## 2016-06-17 NOTE — ED Notes (Addendum)
Pt. and belongings wanded by security 

## 2016-06-17 NOTE — Progress Notes (Signed)
Pt A & O X4. Denies SI, HI, VH and pain. Ambulatory to [facility] with a steady gait. Endorsed depression and +AH when assessed. Per pt "I hear a melody in my right ear, saying you're a fool since yesterday". Pt states she's been depressed for a long time. Stressors are taking care of her 50 y/o son who is Schizophrenic; 17 y/o son who is labile; lives with his girlfriend in her house and does not help to clean or pay bills in the home, quit school at age 53. Pt has been cooperative with care and unit routines thus far. CT scan done per order. All medications administered as prescribed with verbal education; understanding verbalized. Emotional support and availability provided to pt. Snacks offered and tolerated well. Q 15 minutes safety checks maintained without self harm gestures or outburst to note at present.

## 2016-06-17 NOTE — ED Notes (Signed)
Per Psych team, Pt meets Inpatient criteria and is willing to stay.

## 2016-06-17 NOTE — BH Assessment (Signed)
[place] Assessment Progress Note  Per Corena Pilgrim, MD, this pt requires psychiatric hospitalization at this time.  Ria Comment, RN, [facility] has assigned pt to [facility] Rm 507-2; they will be ready to receive pt at 12:00.  Pt has signed Voluntary Admission and Consent for Treatment, as well as Consent to Release Information to her daughter, her husband and her mother, and signed forms have been faxed to [facility].  Pt's nurse, Nicoletta Dress, has been notified, and agrees to send original paperwork along with pt via [facility], and to call report to [phone].  Jalene Mullet, [place] Triage Specialist [phone]

## 2016-06-17 NOTE — ED Triage Notes (Signed)
Pt presents d/t not sleeping x 3 days.  Reports that she keeps hearing the same song over and over again in her right ear.  Cannot name the song. Pt calls it, "Musical ear."  Denies mental health hx.  Pt also endorses having a headache 9/10, generalized throbbing pain.

## 2016-06-17 NOTE — BH Assessment (Signed)
Tele Assessment Note   [name] is an 50 y.o. female. Pt denies SI/HI and AVH. Pt reports hearing music in her right ear. Pt states she hears the same song over and over in her ear. Pt states it's affecting her sleep and ability to hear other things. Pt states the music is not commanding any behaviors. Pt reports a lot of stress in her life. Pt states she is overwhelmed because of ongoing family issues. Pt states her husband and children do not respect her. Pt denies current mental health treatment. Pt reports inpatient treatment 20 years ago because of loss. Pt denies current mental health medication. Pt reports drinking 1x recently. Pt denies ongoing alcohol use because she has a liver infection.   Pending dispostion.  Diagnosis:  F33.1 MDD, moderate  Past Medical History:  Past Medical History:  Diagnosis Date  . Depression    no meds currently  . GERD (gastroesophageal reflux disease)   . MLH1-related Lynch syndrome (HNPCC2)   . Seizures ([place])    Only one back in 2004, none since, no meds, very stressed, patient just lost her husband  . SVD (spontaneous vaginal delivery)    x 4    Past Surgical History:  Procedure Laterality Date  . ANKLE SURGERY Right 2001   skin graft and bone replacment  . CO2 LASER APPLICATION    . COLONOSCOPY WITH PROPOFOL N/A 02/09/2016   Procedure: COLONOSCOPY WITH PROPOFOL;  Surgeon: Garlan Fair, MD;  [facility];  Service: Endoscopy;  Laterality: N/A;  . ESOPHAGOGASTRODUODENOSCOPY (EGD) WITH PROPOFOL N/A 02/09/2016   Procedure: ESOPHAGOGASTRODUODENOSCOPY (EGD) WITH PROPOFOL;  Surgeon: Garlan Fair, MD;  [facility];  Service: Endoscopy;  Laterality: N/A;  . right arm surgery     x 2   . TUBAL LIGATION    . WISDOM TOOTH EXTRACTION      Family History:  Family History  Problem Relation Age of Onset  . Other Mother     hx of TAH-BSO in her late 20s-30 for unspecified cause  . Colon cancer Father     dx.  early-mid-40s with later "recurrence"  . Ovarian cancer Sister 59  . Colon cancer Brother     dx. 65s; +surgery and chemotherapy  . Colon cancer Paternal Aunt 30  . Colon cancer Paternal Uncle 71  . Cancer Maternal Grandmother     NOS cancer, d. 68s  . Breast cancer Paternal Grandmother     d. early 30s  . Colon polyps Other     "many"; dx. late 63s or younger  . Other Daughter     breast issues--infection and fluid had to be removed; also has issues with on-going periods; dx. 15 or younger  . Other Daughter     cyst on her fallopian tube, being monitored; dx. 22 or younger  . Other Brother     issues with recent illness; bloodwork to check for cancer - age 64  . Other Brother     issues with swollen prostate; GI issues; age 23  . Cancer Cousin 26    paternal 1st cousin dx. "bowel" cancer  . Colon cancer Cousin 66    paternal 1st cousin  . Colon cancer Cousin 10    paternal 1st cousin  . Colon cancer Cousin 30    paternal 1st cousin  . Colon cancer Cousin     paternal 1st cousin at unspecified age  . Colon cancer Cousin 35    daughter of affected  paternal 1st cousin  . Uterine cancer Cousin     paternal 1st cousin, d. 10s  . Cancer Brother     paternal half-brother dx. in his 49s; NOS cancer  . Prostate cancer Brother     paternal half-brother d. 73s    Social History:  reports that she has never smoked. She has never used smokeless tobacco. She reports that she drinks alcohol. She reports that she does not use drugs.  Additional Social History:  Alcohol / Drug Use Pain Medications: Pt denies Prescriptions: Pt denies Over the Counter: pt denies   CIWA: CIWA-Ar BP: 161/84 Pulse Rate: 99 COWS:    PATIENT STRENGTHS: (choose at least two) Average or above average intelligence Communication skills  Allergies: No Known Allergies  Home Medications:  (Not in a hospital admission)  OB/GYN Status:  Patient's last menstrual period was 12/30/2015  (approximate).  General Assessment Data [facility]: [facility] TTS Assessment: In system Is this a Tele or Face-to-Face Assessment?: Face-to-Face Is this an Initial Assessment or a Re-assessment for this encounter?: Initial Assessment Marital status: Married [place] name: NA Is patient pregnant?: No Pregnancy Status: No Living Arrangements: Spouse/significant other, Children Can pt return to current living arrangement?: Yes Admission Status: Voluntary Is patient capable of signing voluntary admission?: Yes Referral Source: Self/Family/Friend Insurance type: Medicare     Crisis Care Plan Living Arrangements: Spouse/significant other, Children Legal Guardian: Other: (self) Name of Psychiatrist: NA Name of Therapist: NA  Education Status Is patient currently in school?: No Current Grade: NA Highest grade of school patient has completed: some college Name of school: NA Contact person: NA  Risk to self with the past 6 months Suicidal Ideation: No Has patient been a risk to self within the past 6 months prior to admission? : No Suicidal Intent: No Has patient had any suicidal intent within the past 6 months prior to admission? : No Is patient at risk for suicide?: No Suicidal Plan?: No Has patient had any suicidal plan within the past 6 months prior to admission? : No Access to Means: No What has been your use of drugs/alcohol within the last 12 months?: NA Previous Attempts/Gestures: No How many times?: 0 Other Self Harm Risks: NA Triggers for Past Attempts: None known Intentional Self Injurious Behavior: None Family Suicide History: No Recent stressful life event(s): Conflict (Comment) Persecutory voices/beliefs?: No Depression: Yes Depression Symptoms: Tearfulness, Loss of interest in usual pleasures, Feeling worthless/self pity, Feeling angry/irritable, Isolating Substance abuse history and/or treatment for substance abuse?: No Suicide prevention information  given to non-admitted patients: Not applicable  Risk to Others within the past 6 months Homicidal Ideation: No Does patient have any lifetime risk of violence toward others beyond the six months prior to admission? : No Thoughts of Harm to Others: No Current Homicidal Intent: No Current Homicidal Plan: No Access to Homicidal Means: No Identified Victim: NA History of harm to others?: No Assessment of Violence: None Noted Violent Behavior Description: NA Does patient have access to weapons?: No Criminal Charges Pending?: No Does patient have a court date: No Is patient on probation?: No  Psychosis Hallucinations: None noted Delusions: None noted  Mental Status Report Appearance/Hygiene: Unremarkable Eye Contact: Fair Motor Activity: Freedom of movement Speech: Logical/coherent Level of Consciousness: Alert Mood: Depressed Affect: Depressed Anxiety Level: Minimal Thought Processes: Coherent, Relevant Judgement: Unimpaired Orientation: Person, Place, Time, Situation Obsessive Compulsive Thoughts/Behaviors: None  Cognitive Functioning Concentration: Normal Memory: Recent Intact, Remote Intact IQ: Average Insight: Fair Impulse Control:  Fair Appetite: Fair Weight Loss: 0 Weight Gain: 0 Sleep: Decreased Total Hours of Sleep: 6 Vegetative Symptoms: None  ADLScreening [facility] Assessment Services) Patient's cognitive ability adequate to safely complete daily activities?: Yes Patient able to express need for assistance with ADLs?: Yes Independently performs ADLs?: Yes (appropriate for developmental age)  Prior Inpatient Therapy Prior Inpatient Therapy: Yes Prior Therapy Dates: 20 years ago Prior Therapy Facilty/Provider(s): [facility] Reason for Treatment: depression  Prior Outpatient Therapy Prior Outpatient Therapy: No Prior Therapy Dates: NA Prior Therapy Facilty/Provider(s): NA Reason for Treatment: NA Does patient have an ACCT team?: No Does patient have  Intensive In-House Services?  : No Does patient have [facility] services? : No Does patient have P4CC services?: No  ADL Screening (condition at time of admission) Patient's cognitive ability adequate to safely complete daily activities?: Yes Is the patient deaf or have difficulty hearing?: No Does the patient have difficulty seeing, even when wearing glasses/contacts?: No Does the patient have difficulty concentrating, remembering, or making decisions?: Yes Patient able to express need for assistance with ADLs?: Yes Does the patient have difficulty dressing or bathing?: No Independently performs ADLs?: Yes (appropriate for developmental age) Does the patient have difficulty walking or climbing stairs?: No Weakness of Legs: None Weakness of Arms/Hands: None       Abuse/Neglect Assessment (Assessment to be complete while patient is alone) Physical Abuse: Denies Verbal Abuse: Denies Sexual Abuse: Denies Exploitation of patient/patient's resources: Denies Self-Neglect: Denies     Regulatory affairs officer (For Healthcare) Does Patient Have a Medical Advance Directive?: No Would patient like information on creating a medical advance directive?: No - Patient declined    Additional Information 1:1 In Past 12 Months?: No CIRT Risk: No Elopement Risk: No Does patient have medical clearance?: Yes     Disposition:  Disposition Initial Assessment Completed for this Encounter: Yes  Taray Normoyle D 06/17/2016 8:54 AM

## 2016-06-17 NOTE — Progress Notes (Signed)
Patient ID: [name], female   DOB: October 11, 1965, 50 y.o.   MRN: [id number] PER STATE REGULATIONS 482.30  THIS CHART WAS REVIEWED FOR MEDICAL NECESSITY WITH RESPECT TO THE PATIENT'S ADMISSION/DURATION OF STAY.  NEXT REVIEW DATE:06/21/16  Roma Schanz, RN, BSN CASE MANAGER

## 2016-06-17 NOTE — Tx Team (Signed)
Initial Treatment Plan 06/17/2016 3:27 PM K[place] Bluhm [place] [id number]    PATIENT STRESSORS: Financial difficulties Marital or family conflict   PATIENT STRENGTHS: Capable of independent living Curator fund of knowledge Motivation for treatment/growth   PATIENT IDENTIFIED PROBLEMS: Depression  Anxiety  Psychosis  "I need some stress relief"  "I want the songs to go away"             DISCHARGE CRITERIA:  Improved stabilization in mood, thinking, and/or behavior Verbal commitment to aftercare and medication compliance  PRELIMINARY DISCHARGE PLAN: Outpatient therapy Medication management  PATIENT/FAMILY INVOLVEMENT: This treatment plan has been presented to and reviewed with the patient, Tynishia Avara [place].  The patient and family have been given the opportunity to ask questions and make suggestions.  Windell Moment, RN 06/17/2016, 3:27 PM

## 2016-06-18 ENCOUNTER — Other Ambulatory Visit: Payer: Self-pay

## 2016-06-18 ENCOUNTER — Encounter (HOSPITAL_COMMUNITY): Payer: Self-pay | Admitting: Psychiatry

## 2016-06-18 DIAGNOSIS — Z8041 Family history of malignant neoplasm of ovary: Secondary | ICD-10-CM

## 2016-06-18 DIAGNOSIS — Z8371 Family history of colonic polyps: Secondary | ICD-10-CM

## 2016-06-18 DIAGNOSIS — Z808 Family history of malignant neoplasm of other organs or systems: Secondary | ICD-10-CM

## 2016-06-18 DIAGNOSIS — F1020 Alcohol dependence, uncomplicated: Secondary | ICD-10-CM | POA: Diagnosis present

## 2016-06-18 MED ORDER — RISPERIDONE 0.5 MG PO TABS
0.5000 mg | ORAL_TABLET | Freq: Every day | ORAL | Status: DC
  Administered 2016-06-18 – 2016-06-20 (×3): 0.5 mg via ORAL
  Filled 2016-06-18 (×5): qty 1

## 2016-06-18 MED ORDER — FLUOXETINE HCL 20 MG PO CAPS
20.0000 mg | ORAL_CAPSULE | Freq: Every day | ORAL | Status: DC
  Administered 2016-06-19 – 2016-06-21 (×3): 20 mg via ORAL
  Filled 2016-06-18 (×5): qty 1

## 2016-06-18 MED ORDER — HALOPERIDOL LACTATE 5 MG/ML IJ SOLN: 5.0000 mg | Freq: Three times a day (TID) | INTRAMUSCULAR | Status: DC | PRN

## 2016-06-18 MED ORDER — BENZTROPINE MESYLATE 1 MG PO TABS: 1.0000 mg | ORAL_TABLET | Freq: Three times a day (TID) | ORAL | Status: DC | PRN

## 2016-06-18 MED ORDER — TRAZODONE HCL 100 MG PO TABS
100.0000 mg | ORAL_TABLET | Freq: Every day | ORAL | Status: DC
  Administered 2016-06-18 – 2016-06-20 (×3): 100 mg via ORAL
  Filled 2016-06-18 (×5): qty 1

## 2016-06-18 MED ORDER — HALOPERIDOL 5 MG PO TABS
5.0000 mg | ORAL_TABLET | Freq: Three times a day (TID) | ORAL | Status: DC | PRN
  Administered 2016-06-20: 5 mg via ORAL
  Filled 2016-06-18: qty 1

## 2016-06-18 MED ORDER — PANTOPRAZOLE SODIUM 40 MG PO TBEC
40.0000 mg | DELAYED_RELEASE_TABLET | Freq: Every day | ORAL | Status: DC
  Administered 2016-06-18 – 2016-06-21 (×4): 40 mg via ORAL
  Filled 2016-06-18 (×8): qty 1

## 2016-06-18 MED ORDER — BENZTROPINE MESYLATE 1 MG/ML IJ SOLN: 1.0000 mg | Freq: Three times a day (TID) | INTRAMUSCULAR | Status: DC | PRN

## 2016-06-18 NOTE — [facility] Suicide Risk Assessment (Signed)
[place] INPATIENT:  Family/Significant Other Suicide Prevention Education  Suicide Prevention Education:  Patient Refusal for Family/Significant Other Suicide Prevention Education: The patient [name] has refused to provide written consent for family/significant other to be provided Family/Significant Other Suicide Prevention Education during admission and/or prior to discharge.  Physician notified.  [place] 06/18/2016, 11:28 AM

## 2016-06-18 NOTE — Progress Notes (Signed)
D: Pt A & O X4. Denies SI, HI, AVH and pain at this time. Presents with depressed affect and mood. Per pt, "the voices are ok today, I've not heard anything since 9 pm last night when I was taking shower". Reported a good night sleep, poor appetite, normal energy and good concentration level on self inventory sheet. Rates her depression 4/10, hopelessness 0/10 and anxiety 0/10. Pt's goal for today "meeting and talking to doctor" which was attained this shift.  A: Scheduled medications administered as prescribed with verbal education. EkG done as ordered. Emotional support and availability provided to pt. Writer encouraged pt to voice concerns, comply with medication and scheduled unit group. Q 15 minutes safety checks maintained without outburst or self harm gestures.   R: Pt brightens up on approach and forwards during conversations Verbalized understanding related to medication regimen and has been compliant. Denies adverse drug reactions when assessed.  Attended groups on unit and was engaged. Tolerated EKG and all PO intake well. Denies concerns at this time. POC maintained for safety and mood stability.

## 2016-06-18 NOTE — Plan of Care (Signed)
Problem: Safety: Goal: Periods of time without injury will increase Outcome: Progressing Q 15 minutes safety checks maintained on and off unit without self harm gestures or outburst to note thus far.  Problem: Nutritional: Goal: Ability to achieve adequate nutritional intake will improve Outcome: Progressing Per [facility] staff--Pt ate all her meals in cafeteria. Tolerated snacks and fluids well on unit as well.

## 2016-06-18 NOTE — Progress Notes (Signed)
Adult Psychoeducational Group Note  Date:  06/18/2016 Time:  8:47 PM  Group Topic/Focus:  Wrap-Up Group:   The focus of this group is to help patients review their daily goal of treatment and discuss progress on daily workbooks.   Participation Level:  Active  Participation Quality:  Appropriate  Affect:  Appropriate  Cognitive:  Appropriate  Insight: Appropriate  Engagement in Group:  Engaged  Modes of Intervention:  Discussion  Additional Comments: The patient expressed that he attend group about courage and goals.The patient also said that she rate today a 8. Nash Shearer 06/18/2016, 8:47 PM

## 2016-06-18 NOTE — Progress Notes (Signed)
Recreation Therapy Notes  INPATIENT RECREATION THERAPY ASSESSMENT  Patient Details Name: [name] MRN: [id number] DOB: 1966-06-02 Today's Date: 06/18/2016  Patient Stressors: Family  Pt stated she was here for hearing music ringing in her ear.  Coping Skills:   Substance Abuse, Art/Dance, Talking, Music  Personal Challenges: Relationships, Self-Esteem/Confidence, Stress Management, Time Management  Leisure Interests (2+):  Music - Listen, Individual - Other (Comment) Lacinda Axon, Education administrator the house)  Awareness of Community Resources:  Yes  Community Resources:  Library, Tax inspector  Current Use: Yes  Patient Strengths:  Good with problem solving; good with small children  Patient Identified Areas of Improvement:  Listening; stop being defensive and redirect myself  Current Recreation Participation:  "I don't"  Patient Goal for Hospitalization:  "To find out issues with my problem and how to deal with it when I go home"  [place] of Residence:  [place] of Residence:  [place]   Current [place] (including self-harm):  No  Current [place]:  No  Consent to Intern Participation: N/A   Victorino Sparrow, LRT/CTRS  Ria Comment, Micky Overturf A 06/18/2016, 1:05 PM

## 2016-06-18 NOTE — Progress Notes (Signed)
Patient ID: [name], female   DOB: 1965/10/13, 50 y.o.   MRN: [id number] D: Client has visit from husband and cousin tonight, "my mom was here to" Client reports of visit "not really" good. Client affect is sad, minimum interaction, stays in room most of the shift. Client denies hearing music this shift. A: Writer provided emotional support, encouraged mom to reports any concerns. Medications reviewed, administered as ordered. R: Client is safe on the unit, attended group.

## 2016-06-18 NOTE — Progress Notes (Signed)
Recreation Therapy Notes  Date: 06/18/16 Time: 1000 Location: 500 Hall Dayroom  Group Topic: Communication, Team Building, Problem Solving  Goal Area(s) Addresses:  Patient will effectively work with peer towards shared goal.  Patient will identify skill used to make activity successful.  Patient will identify how skills used during activity can be used to reach post d/c goals.   Behavioral Response: Engaged  Intervention: STEM Activity   Activity: [employer]. Patients were provided the following materials: 5 drinking straws, 5 rubber bands, 5 paper clips, 2 index cards, 2 drinking cups, and 2 toilet paper rolls. Using the provided materials patients were asked to build a launching mechanisms to launch a ping pong ball approximately 12 feet. Patients were divided into teams of 3-5.   Education: Education officer, community, Dentist.   Education Outcome: Acknowledges education/In group clarification offered/Needs additional education.   Clinical Observations/Feedback: Pt worked well with peers. Pt left early with doctor and did not return.    Victorino Sparrow, LRT/CTRS       Victorino Sparrow A 06/18/2016 12:48 PM

## 2016-06-18 NOTE — Progress Notes (Signed)
  D: When speaking to the pt she looked worried and anxious. Stated she was wondering if she was having an allergic reaction to her medicine. When asked if she noticed something different happening, pt stated, "no, my daughter said my skin looked funny. Informed the writer that she "was hearing music earlier, but not now". Pt has no other questions or concerns.    A:  Support and encouragement was offered. 15 min checks continued for safety.  R: Pt remains safe.
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
## 2016-06-18 NOTE — Tx Team (Signed)
Interdisciplinary Treatment and Diagnostic Plan Update  06/18/2016 Time of Session: 1:55 PM  [name] MRN: [id number]  Principal Diagnosis: MDD (major depressive disorder), single episode, severe with psychotic features ([place])  Secondary Diagnoses: Principal Problem:   MDD (major depressive disorder), single episode, severe with psychotic features ([place]) Active Problems:   Alcohol use disorder, moderate, dependence ([place])   Current Medications:  Current Facility-Administered Medications  Medication Dose Route Frequency Provider Last Rate Last Dose  . acetaminophen (TYLENOL) tablet 650 mg  650 mg Oral Q6H PRN Lurena Nida, NP      . alum & mag hydroxide-simeth (MAALOX/MYLANTA) 200-200-20 MG/5ML suspension 30 mL  30 mL Oral Q4H PRN Lurena Nida, NP   30 mL at 06/17/16 1528  . benztropine (COGENTIN) tablet 1 mg  1 mg Oral Q8H PRN Ursula Alert, MD       Or  . benztropine mesylate (COGENTIN) injection 1 mg  1 mg Intramuscular Q8H PRN Ursula Alert, MD      . Derrill Memo ON 06/19/2016] FLUoxetine (PROZAC) capsule 20 mg  20 mg Oral Daily Saramma Eappen, MD      . haloperidol (HALDOL) tablet 5 mg  5 mg Oral Q8H PRN Ursula Alert, MD       Or  . haloperidol lactate (HALDOL) injection 5 mg  5 mg Intramuscular Q8H PRN Ursula Alert, MD      . hydrOXYzine (ATARAX/VISTARIL) tablet 25 mg  25 mg Oral Q6H PRN Rozetta Nunnery, NP      . loperamide (IMODIUM) capsule 2-4 mg  2-4 mg Oral PRN Rozetta Nunnery, NP      . LORazepam (ATIVAN) tablet 1 mg  1 mg Oral Q6H PRN Rozetta Nunnery, NP      . LORazepam (ATIVAN) tablet 1 mg  1 mg Oral QID Rozetta Nunnery, NP   1 mg at 06/18/16 1200   Followed by  . [START ON 06/19/2016] LORazepam (ATIVAN) tablet 1 mg  1 mg Oral TID Rozetta Nunnery, NP       Followed by  . [START ON 06/20/2016] LORazepam (ATIVAN) tablet 1 mg  1 mg Oral BID Rozetta Nunnery, NP       Followed by  . [START ON 06/22/2016] LORazepam (ATIVAN) tablet 1 mg  1 mg Oral Daily Rozetta Nunnery, NP      .  magnesium hydroxide (MILK OF MAGNESIA) suspension 30 mL  30 mL Oral Daily PRN Lurena Nida, NP   30 mL at 06/17/16 1905  . multivitamin with minerals tablet 1 tablet  1 tablet Oral Daily Rozetta Nunnery, NP   1 tablet at 06/18/16 0803  . ondansetron (ZOFRAN-ODT) disintegrating tablet 4 mg  4 mg Oral Q6H PRN Rozetta Nunnery, NP      . pantoprazole (PROTONIX) EC tablet 40 mg  40 mg Oral Daily Rozetta Nunnery, NP   40 mg at 06/18/16 0648  . risperiDONE (RISPERDAL) tablet 0.5 mg  0.5 mg Oral QHS Saramma Eappen, MD      . thiamine (VITAMIN B-1) tablet 100 mg  100 mg Oral Daily Rozetta Nunnery, NP   100 mg at 06/18/16 0803  . traZODone (DESYREL) tablet 100 mg  100 mg Oral QHS Ursula Alert, MD        PTA Medications: Prescriptions Prior to Admission  Medication Sig Dispense Refill Last Dose  . diphenhydrAMINE (BENADRYL) 25 MG tablet Take 75 mg by mouth every 6 (six) hours as needed for  sleep.   06/16/2016 at Unknown time  . doxylamine, Sleep, (UNISOM) 25 MG tablet Take 25 mg by mouth at bedtime as needed for sleep.   06/16/2016 at Unknown time  . naproxen sodium (ANAPROX) 220 MG tablet Take 220 mg by mouth 2 (two) times daily with a meal.   06/16/2016 at Unknown time    Treatment Modalities: Medication Management, Group therapy, Case management,  1 to 1 session with clinician, Psychoeducation, Recreational therapy.   Physician Treatment Plan for Primary Diagnosis: MDD (major depressive disorder), single episode, severe with psychotic features ([place]) Long Term Goal(s): Improvement in symptoms so as ready for discharge  Short Term Goals: Ability to identify and develop effective coping behaviors will improve   Medication Management: Evaluate patient's response, side effects, and tolerance of medication regimen.  Therapeutic Interventions: 1 to 1 sessions, Unit Group sessions and Medication administration.  Evaluation of Outcomes: Progressing  Physician Treatment Plan for Secondary Diagnosis:  Principal Problem:   MDD (major depressive disorder), single episode, severe with psychotic features ([place]) Active Problems:   Alcohol use disorder, moderate, dependence ([place])   Long Term Goal(s): Improvement in symptoms so as ready for discharge  Short Term Goals:  Ability to identify triggers associated with substance abuse/mental health issues will improve  Medication Management: Evaluate patient's response, side effects, and tolerance of medication regimen.  Therapeutic Interventions: 1 to 1 sessions, Unit Group sessions and Medication administration.  Evaluation of Outcomes: Progressing   RN Treatment Plan for Primary Diagnosis: MDD (major depressive disorder), single episode, severe with psychotic features ([place]) Long Term Goal(s): Knowledge of disease and therapeutic regimen to maintain health will improve  Short Term Goals: Ability to identify and develop effective coping behaviors will improve and Compliance with prescribed medications will improve  Medication Management: RN will administer medications as ordered by provider, will assess and evaluate patient's response and provide education to patient for prescribed medication. RN will report any adverse and/or side effects to prescribing provider.  Therapeutic Interventions: 1 on 1 counseling sessions, Psychoeducation, Medication administration, Evaluate responses to treatment, Monitor vital signs and CBGs as ordered, Perform/monitor CIWA, COWS, AIMS and Fall Risk screenings as ordered, Perform wound care treatments as ordered.  Evaluation of Outcomes: Progressing   Recreational Therapy Treatment Plan for Primary Diagnosis: MDD (major depressive disorder), single episode, severe with psychotic features ([place]) Long Term Goal(s): LTG- Patient will participate in recreation therapy tx in at least 2 group sessions without prompting from LRT.  Short Term Goals: Patient will be able to identify at least 5 coping skills for admitting  dx by conclusion of recreation therapy tx.  Treatment Modalities: Group and Pet Therapy  Therapeutic Interventions: Psychoeducation  Evaluation of Outcomes: Progressing     LCSW Treatment Plan for Primary Diagnosis: MDD (major depressive disorder), single episode, severe with psychotic features ([place]) Long Term Goal(s): Safe transition to appropriate next level of care at discharge, Engage patient in therapeutic group addressing interpersonal concerns.  Short Term Goals: Engage patient in aftercare planning with referrals and resources  Therapeutic Interventions: Assess for all discharge needs, 1 to 1 time with Social worker, Explore available resources and support systems, Assess for adequacy in community support network, Educate family and significant other(s) on suicide prevention, Complete Psychosocial Assessment, Interpersonal group therapy.  Evaluation of Outcomes: Met   Progress in Treatment: Attending groups: Yes Participating in groups: Yes Taking medication as prescribed: Yes Toleration medication: Yes, no side effects reported at this time Family/Significant other contact made: No  Pt  did not give permission Patient understands diagnosis: Yes AEB asking for help with depression and psychosis Discussing patient identified problems/goals with staff: Yes Medical problems stabilized or resolved: Yes Denies suicidal/homicidal ideation: Yes Issues/concerns per patient self-inventory: None Other: N/A  New problem(s) identified: None identified at this time.   New Short Term/Long Term Goal(s): None identified at this time.   Discharge Plan or Barriers:  Return home, follow up outpt  Reason for Continuation of Hospitalization: Depression Hallucinations  Medication stabilization Withdrawal symptoms  Estimated Length of Stay: 3-5 days  Attendees: Patient: 06/18/2016  1:55 PM  Physician: Ursula Alert, MD 06/18/2016  1:55 PM  Nursing: Hoy Register, RN 06/18/2016   1:55 PM  RN Care Manager: Lars Pinks, RN 06/18/2016  1:55 PM  Social Worker: Ripley Fraise 06/18/2016  1:55 PM  Recreational Therapist: Laretta Bolster  06/18/2016  1:55 PM  Other: Norberto Sorenson 06/18/2016  1:55 PM  Other:  06/18/2016  1:55 PM    Scribe for Treatment Team:  Roque Lias 06/18/2016 1:55 PM

## 2016-06-18 NOTE — Progress Notes (Signed)
Adult Psychoeducational Group Note  Date:  06/18/2016 Time:  2:12 AM  Group Topic/Focus:  Wrap-Up Group:   The focus of this group is to help patients review their daily goal of treatment and discuss progress on daily workbooks.   Participation Level:  Active  Participation Quality:  Appropriate  Affect:  Appropriate  Cognitive:  Alert  Insight: Appropriate  Engagement in Group:  Engaged  Modes of Intervention:  Discussion  Additional Comments:  Patient states, "my day was fine". On a scale between 1-10, (1=worse, 10=best) patient rated her day a 7. Patient's goal for today is to make sure her medication works.  [name] L [name] 06/18/2016, 2:12 AM

## 2016-06-18 NOTE — BHH Suicide Risk Assessment (Signed)
[facility] Admission Suicide Risk Assessment   Nursing information obtained from:  Patient Demographic factors:  Unemployed Current Mental Status:  NA Loss Factors:  Financial problems / change in socioeconomic status Historical Factors:  Impulsivity Risk Reduction Factors:  Sense of responsibility to family  Total Time spent with patient: 30 minutes Principal Problem: MDD (major depressive disorder), single episode, severe with psychotic features ([place]) Diagnosis:   Patient Active Problem List   Diagnosis Date Noted  . Alcohol use disorder, moderate, dependence ([place]) [F10.20] 06/18/2016  . MDD (major depressive disorder), single episode, severe with psychotic features ([place]) [F32.3] 06/17/2016  . Genetic testing [Z13.79] 03/28/2016  . 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 Family history of colon cancer [Z80.0] 10/16/2015  . Family history of ovarian cancer [Z80.41] 10/16/2015  . Family history of breast cancer in female [Z80.3] 10/16/2015  . History of colonic polyps [Z86.010] 10/16/2015   Subjective Data: Please see H&P.   Continued Clinical Symptoms:  Alcohol Use Disorder Identification Test Final Score (AUDIT): 1 The "Alcohol Use Disorders Identification Test", Guidelines for Use in Primary Care, Second Edition.  World Pharmacologist [facility]). Score between 0-7:  no or low risk or alcohol related problems. Score between 8-15:  moderate risk of alcohol related problems. Score between 16-19:  high risk of alcohol related problems. Score 20 or above:  warrants further diagnostic evaluation for alcohol dependence and treatment.   CLINICAL FACTORS:   Alcohol/Substance Abuse/Dependencies Currently Psychotic Unstable or Poor Therapeutic Relationship Previous Psychiatric Diagnoses and Treatments   Musculoskeletal: Strength & Muscle Tone: within normal limits Gait & Station: normal Patient leans: N/A  Psychiatric Specialty Exam: Physical Exam  ROS  Blood  pressure 90/64, pulse (!) 103, temperature 97.7 F (36.5 C), resp. rate 16, height 5' 4"  (1.626 m), weight 72.6 kg (160 lb), last menstrual period 12/30/2015, SpO2 100 %.Body mass index is 27.46 kg/m.                          Please see H&P.                                 COGNITIVE FEATURES THAT CONTRIBUTE TO RISK:  Closed-mindedness, Polarized thinking and Thought constriction (tunnel vision)    SUICIDE RISK:   Moderate:  Frequent suicidal ideation with limited intensity, and duration, some specificity in terms of plans, no associated intent, good self-control, limited dysphoria/symptomatology, some risk factors present, and identifiable protective factors, including available and accessible social support.   PLAN OF CARE: Please see H&P.   I certify that inpatient services furnished can reasonably be expected to improve the patient's condition.  Branton Einstein, MD 06/18/2016, 11:49 AM

## 2016-06-18 NOTE — BHH Group Notes (Signed)
[place] LCSW Group Therapy  06/18/2016  1:05 PM  Type of Therapy:  Group therapy  Participation Level:  Active  Participation Quality:  Attentive  Affect:  Flat  Cognitive:  Oriented  Insight:  Limited  Engagement in Therapy:  Limited  Modes of Intervention:  Discussion, Socialization  Summary of Progress/Problems:  Chaplain was here to lead a group on themes of hope and courage.  "It took courage to come in to the hospital.  I needed to get help for my problems, but it's not easy to come to the hospital. Facing life became too much for me."  Shared that she needs courage to set firm limits with people that want to come by with bottles of liquor or wine, and also with her son who has no respect for the rules at home.  She feels that it is a lot easier to do the former than the latter.  Roque Lias B 06/18/2016 1:19 PM
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
## 2016-06-19 DIAGNOSIS — F1020 Alcohol dependence, uncomplicated: Secondary | ICD-10-CM

## 2016-06-19 DIAGNOSIS — Z8042 Family history of malignant neoplasm of prostate: Secondary | ICD-10-CM

## 2016-06-19 DIAGNOSIS — Z79899 Other long term (current) drug therapy: Secondary | ICD-10-CM

## 2016-06-19 DIAGNOSIS — Z9889 Other specified postprocedural states: Secondary | ICD-10-CM

## 2016-06-19 DIAGNOSIS — Z803 Family history of malignant neoplasm of breast: Secondary | ICD-10-CM

## 2016-06-19 DIAGNOSIS — Z818 Family history of other mental and behavioral disorders: Secondary | ICD-10-CM

## 2016-06-19 DIAGNOSIS — Z800 Family history of malignant neoplasm of digestive organs: Secondary | ICD-10-CM

## 2016-06-19 DIAGNOSIS — F323 Major depressive disorder, single episode, severe with psychotic features: Principal | ICD-10-CM

## 2016-06-19 DIAGNOSIS — Z813 Family history of other psychoactive substance abuse and dependence: Secondary | ICD-10-CM

## 2016-06-19 LAB — CBC WITH DIFFERENTIAL/PLATELET
Basophils Absolute: 0 10*3/uL (ref 0.0–0.1)
Basophils Relative: 0 %
Eosinophils Absolute: 0.1 10*3/uL (ref 0.0–0.7)
Eosinophils Relative: 1 %
HEMATOCRIT: 37.7 % (ref 36.0–46.0)
HEMOGLOBIN: 12.3 g/dL (ref 12.0–15.0)
LYMPHS ABS: 1.5 10*3/uL (ref 0.7–4.0)
LYMPHS PCT: 30 %
MCH: 30.2 pg (ref 26.0–34.0)
MCHC: 32.6 g/dL (ref 30.0–36.0)
MCV: 92.6 fL (ref 78.0–100.0)
MONOS PCT: 15 %
Monocytes Absolute: 0.8 10*3/uL (ref 0.1–1.0)
NEUTROS ABS: 2.7 10*3/uL (ref 1.7–7.7)
NEUTROS PCT: 54 %
Platelets: 169 10*3/uL (ref 150–400)
RBC: 4.07 MIL/uL (ref 3.87–5.11)
RDW: 16.1 % — ABNORMAL HIGH (ref 11.5–15.5)
WBC: 5 10*3/uL (ref 4.0–10.5)

## 2016-06-19 LAB — LIPID PANEL
CHOL/HDL RATIO: 1.6 ratio
Cholesterol: 183 mg/dL (ref 0–200)
HDL: 111 mg/dL (ref >40)
LDL CALC: 60 mg/dL (ref 0–99)
Triglycerides: 62 mg/dL (ref <150)
VLDL: 12 mg/dL (ref 0–40)

## 2016-06-19 LAB — HEPATIC FUNCTION PANEL
ALK PHOS: 51 U/L (ref 38–126)
ALT: 57 U/L — ABNORMAL HIGH (ref 14–54)
AST: 64 U/L — ABNORMAL HIGH (ref 15–41)
Albumin: 4.2 g/dL (ref 3.5–5.0)
BILIRUBIN INDIRECT: 0.8 mg/dL (ref 0.3–0.9)
BILIRUBIN TOTAL: 0.9 mg/dL (ref 0.3–1.2)
Bilirubin, Direct: 0.1 mg/dL (ref 0.1–0.5)
Total Protein: 7.3 g/dL (ref 6.5–8.1)

## 2016-06-19 LAB — TSH: TSH: 2.642 u[IU]/mL (ref 0.350–4.500)

## 2016-06-19 NOTE — Progress Notes (Signed)
D    Pt is pleasant and cooperative    She said her medications are working so good for her and she doesn't hear those sounds anymore   She talked about having problems sleeping before she came to the hospital and had not slept in 5 days    Pt tends to isolate but has appropriate behaviors A    Verbal support given   Medications administered and effectiveness monitored    Q 15 min checks  R   Pt is safe at present time and receptive to verbal support

## 2016-06-19 NOTE — Progress Notes (Signed)
[facility] MD Progress Note  06/19/2016 4:02 PM [name]  MRN:  [id number]  Subjective: [name] reports, "I am doing alright today. I was hearing voices when I came to the hospital, none heard today".  Objective: [name] a 50 y.o.AA female, who is married , on SSD for medical reasons ( ankle fracture - s/p fusion surgery) , who lives in [place] , with her family , has a hx of alcohol abuse as well as depression, presented to [facility]  With worsening depression and AH.  [name] is seen, chart reviewed. She is alert, oriented x 3. She is visible on the unit. She is participating in the group milieu. She is compliant with her medication regimen & denies any adverse effects. She denies any AVH, SIHI today. She says her mood is improving.  Principal Problem: MDD (major depressive disorder), single episode, severe with psychotic features ([place])  Diagnosis:   Patient Active Problem List   Diagnosis Date Noted  . Alcohol use disorder, moderate, dependence ([place]) [F10.20] 06/18/2016  . MDD (major depressive disorder), single episode, severe with psychotic features ([place]) [F32.3] 06/17/2016  . Genetic testing [Z13.79] 03/28/2016  . 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 Family history of colon cancer [Z80.0] 10/16/2015  . Family history of ovarian cancer [Z80.41] 10/16/2015  . Family history of breast cancer in female [Z80.3] 10/16/2015  . History of colonic polyps [Z86.010] 10/16/2015   Total Time spent with patient: 25 minutes  Past Psychiatric History: See H&P  Past Medical History:  Past Medical History:  Diagnosis Date  . Depression    no meds currently  . GERD (gastroesophageal reflux disease)   . MLH1-related Lynch syndrome (HNPCC2)   . Seizures ([place])    Only one back in 2004, none since, no meds, very stressed, patient just lost her husband  . SVD (spontaneous vaginal delivery)    x 4    Past Surgical History:  Procedure Laterality Date  . ANKLE SURGERY Right  2001   skin graft and bone replacment  . CO2 LASER APPLICATION    . COLONOSCOPY WITH PROPOFOL N/A 02/09/2016   Procedure: COLONOSCOPY WITH PROPOFOL;  Surgeon: Garlan Fair, MD;  Location: [facility] ENDOSCOPY;  Service: Endoscopy;  Laterality: N/A;  . ESOPHAGOGASTRODUODENOSCOPY (EGD) WITH PROPOFOL N/A 02/09/2016   Procedure: ESOPHAGOGASTRODUODENOSCOPY (EGD) WITH PROPOFOL;  Surgeon: Garlan Fair, MD;  Location: [facility] ENDOSCOPY;  Service: Endoscopy;  Laterality: N/A;  . right arm surgery     x 2   . TUBAL LIGATION    . WISDOM TOOTH EXTRACTION     Family History:  Family History  Problem Relation Age of Onset  . Other Mother     hx of TAH-BSO in her late 20s-30 for unspecified cause  . Colon cancer Father     dx. early-mid-40s with later "recurrence"  . Ovarian cancer Sister 49  . Colon cancer Brother     dx. 62s; +surgery and chemotherapy  . Colon cancer Paternal Aunt 36  . Colon cancer Paternal Uncle 50  . Cancer Maternal Grandmother     NOS cancer, d. 70s  . Breast cancer Paternal Grandmother     d. early 70s  . Colon polyps Other     "many"; dx. late 53s or younger  . Other Daughter     breast issues--infection and fluid had to be removed; also has issues with on-going periods; dx. 15 or younger  . Other Daughter     cyst on her fallopian  tube, being monitored; dx. 1 or younger  . Other Brother     issues with recent illness; bloodwork to check for cancer - age 64  . Other Brother     issues with swollen prostate; GI issues; age 15  . Cancer Cousin 54    paternal 1st cousin dx. "bowel" cancer  . Colon cancer Cousin 7    paternal 1st cousin  . Colon cancer Cousin 60    paternal 1st cousin  . Colon cancer Cousin 64    paternal 1st cousin  . Colon cancer Cousin     paternal 1st cousin at unspecified age  . Colon cancer Cousin 65    daughter of affected paternal 1st cousin  . Uterine cancer Cousin     paternal 1st cousin, d. 52s  . Schizophrenia Son   . Drug abuse Son    . Cancer Brother     paternal half-brother dx. in his 17s; NOS cancer  . Prostate cancer Brother     paternal half-brother d. 86s   Family Psychiatric  History: See H&P  Social History:  History  Alcohol Use  . Yes    Comment: hx of EtOH abuse; only drinks twice a yr now, but then "drinks heavy"     History  Drug Use No    Social History   Social History  . Marital status: Married    Spouse name: N/A  . Number of children: N/A  . Years of education: N/A   Social History Main Topics  . Smoking status: Never Smoker  . Smokeless tobacco: Never Used  . Alcohol use Yes     Comment: hx of EtOH abuse; only drinks twice a yr now, but then "drinks heavy"  . Drug use: No  . Sexual activity: Not Asked   Other Topics Concern  . None   Social History Narrative  . None   Additional Social History:    Pain Medications: Pt denies Prescriptions: Pt denies Over the Counter: pt denies  History of alcohol / drug use?: No history of alcohol / drug abuse  Sleep: Good  Appetite:  Fair  Current Medications: Current Facility-Administered Medications  Medication Dose Route Frequency Provider Last Rate Last Dose  . acetaminophen (TYLENOL) tablet 650 mg  650 mg Oral Q6H PRN Lurena Nida, NP      . alum & mag hydroxide-simeth (MAALOX/MYLANTA) 200-200-20 MG/5ML suspension 30 mL  30 mL Oral Q4H PRN Lurena Nida, NP   30 mL at 06/19/16 1046  . benztropine (COGENTIN) tablet 1 mg  1 mg Oral Q8H PRN Ursula Alert, MD       Or  . benztropine mesylate (COGENTIN) injection 1 mg  1 mg Intramuscular Q8H PRN Ursula Alert, MD      . FLUoxetine (PROZAC) capsule 20 mg  20 mg Oral Daily Saramma Eappen, MD   20 mg at 06/19/16 0836  . haloperidol (HALDOL) tablet 5 mg  5 mg Oral Q8H PRN Ursula Alert, MD       Or  . haloperidol lactate (HALDOL) injection 5 mg  5 mg Intramuscular Q8H PRN Ursula Alert, MD      . hydrOXYzine (ATARAX/VISTARIL) tablet 25 mg  25 mg Oral Q6H PRN Rozetta Nunnery, NP       . loperamide (IMODIUM) capsule 2-4 mg  2-4 mg Oral PRN Rozetta Nunnery, NP      . LORazepam (ATIVAN) tablet 1 mg  1 mg Oral Q6H PRN Rozetta Nunnery,  NP      . LORazepam (ATIVAN) tablet 1 mg  1 mg Oral TID Rozetta Nunnery, NP   1 mg at 06/19/16 1155   Followed by  . [START ON 06/20/2016] LORazepam (ATIVAN) tablet 1 mg  1 mg Oral BID Rozetta Nunnery, NP       Followed by  . [START ON 06/22/2016] LORazepam (ATIVAN) tablet 1 mg  1 mg Oral Daily Rozetta Nunnery, NP      . magnesium hydroxide (MILK OF MAGNESIA) suspension 30 mL  30 mL Oral Daily PRN Lurena Nida, NP   30 mL at 06/17/16 1905  . multivitamin with minerals tablet 1 tablet  1 tablet Oral Daily Rozetta Nunnery, NP   1 tablet at 06/19/16 0836  . ondansetron (ZOFRAN-ODT) disintegrating tablet 4 mg  4 mg Oral Q6H PRN Rozetta Nunnery, NP      . pantoprazole (PROTONIX) EC tablet 40 mg  40 mg Oral Daily Rozetta Nunnery, NP   40 mg at 06/19/16 0836  . risperiDONE (RISPERDAL) tablet 0.5 mg  0.5 mg Oral QHS Ursula Alert, MD   0.5 mg at 06/18/16 2208  . thiamine (VITAMIN B-1) tablet 100 mg  100 mg Oral Daily Rozetta Nunnery, NP   100 mg at 06/19/16 0836  . traZODone (DESYREL) tablet 100 mg  100 mg Oral QHS Ursula Alert, MD   100 mg at 06/18/16 2208    Lab Results:  Results for orders placed or performed during the hospital encounter of 06/17/16 (from the past 48 hour(s))  CBC with Differential/Platelet     Status: Abnormal   Collection Time: 06/19/16  6:31 AM  Result Value Ref Range   WBC 5.0 4.0 - 10.5 K/uL   RBC 4.07 3.87 - 5.11 MIL/uL   Hemoglobin 12.3 12.0 - 15.0 g/dL   HCT 37.7 36.0 - 46.0 %   MCV 92.6 78.0 - 100.0 fL   MCH 30.2 26.0 - 34.0 pg   MCHC 32.6 30.0 - 36.0 g/dL   RDW 16.1 (H) 11.5 - 15.5 %   Platelets 169 150 - 400 K/uL   Neutrophils Relative % 54 %   Neutro Abs 2.7 1.7 - 7.7 K/uL   Lymphocytes Relative 30 %   Lymphs Abs 1.5 0.7 - 4.0 K/uL   Monocytes Relative 15 %   Monocytes Absolute 0.8 0.1 - 1.0 K/uL   Eosinophils Relative 1 %    Eosinophils Absolute 0.1 0.0 - 0.7 K/uL   Basophils Relative 0 %   Basophils Absolute 0.0 0.0 - 0.1 K/uL    Comment: Performed at [facility]  Hepatic function panel     Status: Abnormal   Collection Time: 06/19/16  6:31 AM  Result Value Ref Range   Total Protein 7.3 6.5 - 8.1 g/dL   Albumin 4.2 3.5 - 5.0 g/dL   AST 64 (H) 15 - 41 U/L   ALT 57 (H) 14 - 54 U/L   Alkaline Phosphatase 51 38 - 126 U/L   Total Bilirubin 0.9 0.3 - 1.2 mg/dL   Bilirubin, Direct 0.1 0.1 - 0.5 mg/dL   Indirect Bilirubin 0.8 0.3 - 0.9 mg/dL    Comment: Performed at [facility]  TSH     Status: None   Collection Time: 06/19/16  6:31 AM  Result Value Ref Range   TSH 2.642 0.350 - 4.500 uIU/mL    Comment: Performed by a 3rd Generation assay with a functional sensitivity  of <=0.01 uIU/mL. Performed at [facility]   Lipid panel     Status: None   Collection Time: 06/19/16  6:31 AM  Result Value Ref Range   Cholesterol 183 0 - 200 mg/dL   Triglycerides 62 <150 mg/dL   HDL 111 >40 mg/dL   Total CHOL/HDL Ratio 1.6 RATIO   VLDL 12 0 - 40 mg/dL   LDL Cholesterol 60 0 - 99 mg/dL    Comment:        Total Cholesterol/HDL:CHD Risk Coronary Heart Disease Risk Table                     Men   Women  1/2 Average Risk   3.4   3.3  Average Risk       5.0   4.4  2 X Average Risk   9.6   7.1  3 X Average Risk  23.4   11.0        Use the calculated Patient Ratio above and the CHD Risk Table to determine the patient's CHD Risk.        ATP III CLASSIFICATION (LDL):  <100     mg/dL   Optimal  100-129  mg/dL   Near or Above                    Optimal  130-159  mg/dL   Borderline  160-189  mg/dL   High  >190     mg/dL   Very High Performed at [facility]     Blood Alcohol level:  Lab Results  Component Value Date   [facility] <5 06/17/2016   [facility]  09/07/2008    <5        LOWEST DETECTABLE LIMIT FOR SERUM ALCOHOL IS 5 mg/dL FOR MEDICAL PURPOSES ONLY     Metabolic Disorder Labs: No results found for: HGBA1C, MPG No results found for: PROLACTIN Lab Results  Component Value Date   CHOL 183 06/19/2016   TRIG 62 06/19/2016   HDL 111 06/19/2016   CHOLHDL 1.6 06/19/2016   VLDL 12 06/19/2016   LDLCALC 60 06/19/2016    Physical Findings: AIMS: Facial and Oral Movements Muscles of Facial Expression: None, normal Lips and Perioral Area: None, normal Jaw: None, normal Tongue: None, normal,Extremity Movements Upper (arms, wrists, hands, fingers): None, normal Lower (legs, knees, ankles, toes): None, normal, Trunk Movements Neck, shoulders, hips: None, normal, Overall Severity Severity of abnormal movements (highest score from questions above): None, normal Incapacitation due to abnormal movements: None, normal Patient's awareness of abnormal movements (rate only patient's report): No Awareness, Dental Status Current problems with teeth and/or dentures?: No Does patient usually wear dentures?: No  CIWA:  CIWA-Ar Total: 2 COWS:     Musculoskeletal: Strength & Muscle Tone: within normal limits Gait & Station: normal Patient leans: N/A  Psychiatric Specialty Exam: Physical Exam: Vital signs & nurses notes reviewed.  ROS:   Blood pressure 109/66, pulse 90, temperature 98.4 F (36.9 C), temperature source Oral, resp. rate 16, height 5' 4"  (1.626 m), weight 72.6 kg (160 lb), last menstrual period 12/30/2015, SpO2 100 %.Body mass index is 27.46 kg/m.  General Appearance: Disheveled  Eye Contact:  Fair  Speech:  Normal Rate  Volume:  Decreased  Mood:  Anxious and Depressed  Affect:  Depressed  Thought Process:  Goal Directed and Descriptions of Associations: Circumstantial  Orientation:  Full (Time, Place, and Person)  Thought Content:  Hallucinations: Auditory and Rumination  hears music that does not stop and frustrates her  Suicidal Thoughts:  No but is hopeless, depressed  Homicidal Thoughts:  No  Memory:  Immediate;    Fair Recent;   Fair Remote;   Fair  Judgement:  Impaired  Insight:  Shallow  Psychomotor Activity:  Normal  Concentration:  Concentration: Fair and Attention Span: Fair  Recall:  [employer] of Knowledge:  Fair  Language:  Fair  Akathisia:  No  Handed:  Right  AIMS (if indicated):     Assets:  Desire for Improvement  ADL's:  Intact  Cognition:  WNL  Sleep:  Number of Hours: 6.75     Treatment Plan Summary:  MDD (major depressive disorder), single episode, severe with psychotic features ([place]) currently acute ([place]). Alcohol use disorder, moderate, dependence. unstable  Will continue today 06/19/16 plan as below except where it is noted.  Daily contact with patient to assess and evaluate symptoms and progress in treatment and Medication management For psychosis: Will continue Risperdal 0.5 mg Q bedtime. Will continue Cogentin 1 mg po or IM for prevention of drug induced EPS..  For affective sx: Will continue Prozac 20 mg po daily.  For Alcohol withdrawal symptoms/detox. Will continue Ativan detox protocols.  For anxiety/agitation: Will continue PRN medications as per agitation protocol. Hydroxyzine 25 mg prn. Haldol 5 mg PO or IM prn.  Insomnia: Will continue Trazodone 100 mg q bedtime.   Will continue to monitor vitals, medication compliance and treatment side effects while patient is here.   Will monitor for medical issues as well as call consult as needed.   Reviewed current lab reports, patient with elevated liver enzymes AST & ALT. I have reviewed EKG for qtc monitoring- wnl .  CSW will continue working on disposition. Will work on getting collateral information from family.  Patient to participate in therapeutic milieu .   Encarnacion Slates, NP, PMHNP, FNP-BC. 06/19/2016, 4:02 PM  I agree to the findings and plan

## 2016-06-19 NOTE — Progress Notes (Signed)
DAR NOTE: Patient presents with anxious affect and depressed mood.  Denies pain, auditory and visual hallucinations.  Rates depression at 3, hopelessness at 0, and anxiety at 8.  Reports withdrawal symptoms of tremor and nausea.  Maintained on routine safety checks.  Medications given as prescribed.  Support and encouragement offered as needed.  Attended group and participated.  States goal for today is "move forward and pray."  Patient was visible in milieu for activity.

## 2016-06-19 NOTE — BHH Group Notes (Signed)
[place] Group Notes:  (Clinical Social Work)  06/19/2016  11:15-12:00PM  Summary of Progress/Problems:   Today's process group involved patients discussing their feelings related to being hospitalized, as well as benefits they see to being in the hospital.  The group brainstormed specific ways in which they could seek for those same benefits to happen when they discharge and go back home in order to prevent future hospitalizations. The patient expressed a primary feeling about being hospitalized is good because she volunteered to admit herself, had a choice between outpatient and inpatient and thought it would be good to get away from her regular life to heal.  She did state it feels somewhat like a prison, not having freedom.  She is invested in staying on her medication, being honest, and following all the treatment that is recommended.  Type of Therapy:  Group Therapy - Process  Participation Level:  Active  Participation Quality:  Attentive, Sharing and Supportive  Affect:  Blunted  Cognitive:  Appropriate  Insight:  Improving  Engagement in Therapy:  Engaged  Modes of Intervention:  Exploration, Discussion  Selmer Dominion, LCSW 06/19/2016, 12:25 PM

## 2016-06-19 NOTE — Plan of Care (Signed)
Problem: Safety: Goal: Ability to remain free from injury will improve Outcome: Progressing Client is safe on the unit AEB q49min safety checks, client remains safe on the unit.

## 2016-06-20 LAB — HEMOGLOBIN A1C
Hgb A1c MFr Bld: 4.8 % (ref 4.8–5.6)
MEAN PLASMA GLUCOSE: 91 mg/dL

## 2016-06-20 NOTE — BHH Group Notes (Signed)
[place] Group Notes:  (Clinical Social Work)  06/20/2016  11:00AM-12:00PM  Summary of Progress/Problems:  The main focus of today's process group was to listen to a variety of genres of music and to identify that different types of music provoke different responses.  The patient then was able to identify personally what was soothing for them, as well as energizing, as well as how patient can personally use this knowledge in sleep habits, with depression, and with other symptoms.  The patient expressed at the beginning of group the overall feeling of "great" and danced/sang/enjoyed almost all the music.  At the end of group she stated she felt "better."  Type of Therapy:  Music Therapy   Participation Level:  Active  Participation Quality:  Attentive and Sharing  Affect:  Blunted and Labile  Cognitive:  Oriented  Insight:  Engaged  Engagement in Therapy:  Engaged  Modes of Intervention:   Activity, Exploration  Selmer Dominion, LCSW 06/20/2016

## 2016-06-20 NOTE — Progress Notes (Signed)
[facility] MD Progress Note  06/20/2016 2:47 PM [name]  MRN:  [id number]  Subjective: [name] reports, "I'm doing fine today. I'm ready to be discharged tomorrow"  Objective: [name] a 50 y.o.AA female, who is married , on SSD for medical reasons ( ankle fracture - s/p fusion surgery) , who lives in [place] , with her family , has a hx of alcohol abuse as well as depression, presented to [facility]  With worsening depression and AH.  [name] is seen, chart reviewed. She is alert, oriented x 3. She is visible on the unit. She is participating in the group milieu. She is compliant with her medication regimen & denies any adverse effects. She denies any AVH, SIHI today. She says her mood is improving.   Principal Problem: MDD (major depressive disorder), single episode, severe with psychotic features ([place])  Diagnosis:   Patient Active Problem List   Diagnosis Date Noted  . Alcohol use disorder, moderate, dependence ([place]) [F10.20] 06/18/2016  . MDD (major depressive disorder), single episode, severe with psychotic features ([place]) [F32.3] 06/17/2016  . Genetic testing [Z13.79] 03/28/2016  . 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 Family history of colon cancer [Z80.0] 10/16/2015  . Family history of ovarian cancer [Z80.41] 10/16/2015  . Family history of breast cancer in female [Z80.3] 10/16/2015  . History of colonic polyps [Z86.010] 10/16/2015   Total Time spent with patient: 15 minutes  Past Psychiatric History: See H&P  Past Medical History:  Past Medical History:  Diagnosis Date  . Depression    no meds currently  . GERD (gastroesophageal reflux disease)   . MLH1-related Lynch syndrome (HNPCC2)   . Seizures ([place])    Only one back in 2004, none since, no meds, very stressed, patient just lost her husband  . SVD (spontaneous vaginal delivery)    x 4    Past Surgical History:  Procedure Laterality Date  . ANKLE SURGERY Right 2001   skin graft and bone replacment   . CO2 LASER APPLICATION    . COLONOSCOPY WITH PROPOFOL N/A 02/09/2016   Procedure: COLONOSCOPY WITH PROPOFOL;  Surgeon: Garlan Fair, MD;  Location: [facility];  Service: Endoscopy;  Laterality: N/A;  . ESOPHAGOGASTRODUODENOSCOPY (EGD) WITH PROPOFOL N/A 02/09/2016   Procedure: ESOPHAGOGASTRODUODENOSCOPY (EGD) WITH PROPOFOL;  Surgeon: Garlan Fair, MD;  Location: [facility];  Service: Endoscopy;  Laterality: N/A;  . right arm surgery     x 2   . TUBAL LIGATION    . WISDOM TOOTH EXTRACTION     Family History:  Family History  Problem Relation Age of Onset  . Other Mother     hx of TAH-BSO in her late 20s-30 for unspecified cause  . Colon cancer Father     dx. early-mid-40s with later "recurrence"  . Ovarian cancer Sister 82  . Colon cancer Brother     dx. 45s; +surgery and chemotherapy  . Colon cancer Paternal Aunt 18  . Colon cancer Paternal Uncle 35  . Cancer Maternal Grandmother     NOS cancer, d. 42s  . Breast cancer Paternal Grandmother     d. early 53s  . Colon polyps Other     "many"; dx. late 16s or younger  . Other Daughter     breast issues--infection and fluid had to be removed; also has issues with on-going periods; dx. 15 or younger  . Other Daughter     cyst on her fallopian tube, being monitored; dx. 28 or younger  .  Other Brother     issues with recent illness; bloodwork to check for cancer - age 51  . Other Brother     issues with swollen prostate; GI issues; age 38  . Cancer Cousin 75    paternal 1st cousin dx. "bowel" cancer  . Colon cancer Cousin 41    paternal 1st cousin  . Colon cancer Cousin 35    paternal 1st cousin  . Colon cancer Cousin 30    paternal 1st cousin  . Colon cancer Cousin     paternal 1st cousin at unspecified age  . Colon cancer Cousin 52    daughter of affected paternal 1st cousin  . Uterine cancer Cousin     paternal 1st cousin, d. 55s  . Schizophrenia Son   . Drug abuse Son   . Cancer Brother     paternal  half-brother dx. in his 51s; NOS cancer  . Prostate cancer Brother     paternal half-brother d. 52s   Family Psychiatric  History: See H&P  Social History:  History  Alcohol Use  . Yes    Comment: hx of EtOH abuse; only drinks twice a yr now, but then "drinks heavy"     History  Drug Use No    Social History   Social History  . Marital status: Married    Spouse name: N/A  . Number of children: N/A  . Years of education: N/A   Social History Main Topics  . Smoking status: Never Smoker  . Smokeless tobacco: Never Used  . Alcohol use Yes     Comment: hx of EtOH abuse; only drinks twice a yr now, but then "drinks heavy"  . Drug use: No  . Sexual activity: Not Asked   Other Topics Concern  . None   Social History Narrative  . None   Additional Social History:    Pain Medications: Pt denies Prescriptions: Pt denies Over the Counter: pt denies  History of alcohol / drug use?: No history of alcohol / drug abuse  Sleep: Good  Appetite:  Fair  Current Medications: Current Facility-Administered Medications  Medication Dose Route Frequency Provider Last Rate Last Dose  . acetaminophen (TYLENOL) tablet 650 mg  650 mg Oral Q6H PRN Lurena Nida, NP      . alum & mag hydroxide-simeth (MAALOX/MYLANTA) 200-200-20 MG/5ML suspension 30 mL  30 mL Oral Q4H PRN Lurena Nida, NP   30 mL at 06/19/16 1046  . benztropine (COGENTIN) tablet 1 mg  1 mg Oral Q8H PRN Ursula Alert, MD       Or  . benztropine mesylate (COGENTIN) injection 1 mg  1 mg Intramuscular Q8H PRN Saramma Eappen, MD      . FLUoxetine (PROZAC) capsule 20 mg  20 mg Oral Daily Saramma Eappen, MD   20 mg at 06/20/16 8016  . haloperidol (HALDOL) tablet 5 mg  5 mg Oral Q8H PRN Ursula Alert, MD   5 mg at 06/20/16 1109   Or  . haloperidol lactate (HALDOL) injection 5 mg  5 mg Intramuscular Q8H PRN Ursula Alert, MD      . hydrOXYzine (ATARAX/VISTARIL) tablet 25 mg  25 mg Oral Q6H PRN Rozetta Nunnery, NP   25 mg at  06/19/16 2051  . loperamide (IMODIUM) capsule 2-4 mg  2-4 mg Oral PRN Rozetta Nunnery, NP      . LORazepam (ATIVAN) tablet 1 mg  1 mg Oral Q6H PRN Rozetta Nunnery, NP  1 mg at 06/19/16 2051  . LORazepam (ATIVAN) tablet 1 mg  1 mg Oral BID Rozetta Nunnery, NP       Followed by  . [START ON 06/22/2016] LORazepam (ATIVAN) tablet 1 mg  1 mg Oral Daily Rozetta Nunnery, NP      . magnesium hydroxide (MILK OF MAGNESIA) suspension 30 mL  30 mL Oral Daily PRN Lurena Nida, NP   30 mL at 06/17/16 1905  . multivitamin with minerals tablet 1 tablet  1 tablet Oral Daily Rozetta Nunnery, NP   1 tablet at 06/20/16 [id number]  . ondansetron (ZOFRAN-ODT) disintegrating tablet 4 mg  4 mg Oral Q6H PRN Rozetta Nunnery, NP      . pantoprazole (PROTONIX) EC tablet 40 mg  40 mg Oral Daily Rozetta Nunnery, NP   40 mg at 06/20/16 4431  . risperiDONE (RISPERDAL) tablet 0.5 mg  0.5 mg Oral QHS Ursula Alert, MD   0.5 mg at 06/19/16 2051  . thiamine (VITAMIN B-1) tablet 100 mg  100 mg Oral Daily Rozetta Nunnery, NP   100 mg at 06/20/16 5400  . traZODone (DESYREL) tablet 100 mg  100 mg Oral QHS Ursula Alert, MD   100 mg at 06/19/16 2051   Lab Results:  Results for orders placed or performed during the hospital encounter of 06/17/16 (from the past 48 hour(s))  CBC with Differential/Platelet     Status: Abnormal   Collection Time: 06/19/16  6:31 AM  Result Value Ref Range   WBC 5.0 4.0 - 10.5 K/uL   RBC 4.07 3.87 - 5.11 MIL/uL   Hemoglobin 12.3 12.0 - 15.0 g/dL   HCT 37.7 36.0 - 46.0 %   MCV 92.6 78.0 - 100.0 fL   MCH 30.2 26.0 - 34.0 pg   MCHC 32.6 30.0 - 36.0 g/dL   RDW 16.1 (H) 11.5 - 15.5 %   Platelets 169 150 - 400 K/uL   Neutrophils Relative % 54 %   Neutro Abs 2.7 1.7 - 7.7 K/uL   Lymphocytes Relative 30 %   Lymphs Abs 1.5 0.7 - 4.0 K/uL   Monocytes Relative 15 %   Monocytes Absolute 0.8 0.1 - 1.0 K/uL   Eosinophils Relative 1 %   Eosinophils Absolute 0.1 0.0 - 0.7 K/uL   Basophils Relative 0 %   Basophils Absolute 0.0  0.0 - 0.1 K/uL    Comment: Performed at [facility]  Hepatic function panel     Status: Abnormal   Collection Time: 06/19/16  6:31 AM  Result Value Ref Range   Total Protein 7.3 6.5 - 8.1 g/dL   Albumin 4.2 3.5 - 5.0 g/dL   AST 64 (H) 15 - 41 U/L   ALT 57 (H) 14 - 54 U/L   Alkaline Phosphatase 51 38 - 126 U/L   Total Bilirubin 0.9 0.3 - 1.2 mg/dL   Bilirubin, Direct 0.1 0.1 - 0.5 mg/dL   Indirect Bilirubin 0.8 0.3 - 0.9 mg/dL    Comment: Performed at [facility]  TSH     Status: None   Collection Time: 06/19/16  6:31 AM  Result Value Ref Range   TSH 2.642 0.350 - 4.500 uIU/mL    Comment: Performed by a 3rd Generation assay with a functional sensitivity of <=0.01 uIU/mL. Performed at [facility]   Lipid panel     Status: None   Collection Time: 06/19/16  6:31 AM  Result Value Ref  Range   Cholesterol 183 0 - 200 mg/dL   Triglycerides 62 <150 mg/dL   HDL 111 >40 mg/dL   Total CHOL/HDL Ratio 1.6 RATIO   VLDL 12 0 - 40 mg/dL   LDL Cholesterol 60 0 - 99 mg/dL    Comment:        Total Cholesterol/HDL:CHD Risk Coronary Heart Disease Risk Table                     Men   Women  1/2 Average Risk   3.4   3.3  Average Risk       5.0   4.4  2 X Average Risk   9.6   7.1  3 X Average Risk  23.4   11.0        Use the calculated Patient Ratio above and the CHD Risk Table to determine the patient's CHD Risk.        ATP III CLASSIFICATION (LDL):  <100     mg/dL   Optimal  100-129  mg/dL   Near or Above                    Optimal  130-159  mg/dL   Borderline  160-189  mg/dL   High  >190     mg/dL   Very High Performed at [facility]   Hemoglobin A1c     Status: None   Collection Time: 06/19/16  6:31 AM  Result Value Ref Range   Hgb A1c MFr Bld 4.8 4.8 - 5.6 %    Comment: (NOTE)         Pre-diabetes: 5.7 - 6.4         Diabetes: >6.4         Glycemic control for adults with diabetes: <7.0    Mean Plasma Glucose 91  mg/dL    Comment: (NOTE) Performed At: [facility] [place], [place] [id number] Lindon Romp MD [id number] Performed at [facility]    Blood Alcohol level:  Lab Results  Component Value Date   [facility] <5 06/17/2016   [facility]  09/07/2008    <5        LOWEST DETECTABLE LIMIT FOR SERUM ALCOHOL IS 5 mg/dL FOR MEDICAL PURPOSES ONLY   Metabolic Disorder Labs: Lab Results  Component Value Date   HGBA1C 4.8 06/19/2016   MPG 91 06/19/2016   No results found for: PROLACTIN Lab Results  Component Value Date   CHOL 183 06/19/2016   TRIG 62 06/19/2016   HDL 111 06/19/2016   CHOLHDL 1.6 06/19/2016   VLDL 12 06/19/2016   LDLCALC 60 06/19/2016   Physical Findings: AIMS: Facial and Oral Movements Muscles of Facial Expression: None, normal Lips and Perioral Area: None, normal Jaw: None, normal Tongue: None, normal,Extremity Movements Upper (arms, wrists, hands, fingers): None, normal Lower (legs, knees, ankles, toes): None, normal, Trunk Movements Neck, shoulders, hips: None, normal, Overall Severity Severity of abnormal movements (highest score from questions above): None, normal Incapacitation due to abnormal movements: None, normal Patient's awareness of abnormal movements (rate only patient's report): No Awareness, Dental Status Current problems with teeth and/or dentures?: No Does patient usually wear dentures?: No  CIWA:  CIWA-Ar Total: 2 COWS:     Musculoskeletal: Strength & Muscle Tone: within normal limits Gait & Station: normal Patient leans: N/A  Psychiatric Specialty Exam: Physical Exam: Vital signs & nurses notes reviewed.  ROS:   Blood pressure 109/62, pulse  97, temperature 98.8 F (37.1 C), temperature source Oral, resp. rate 16, height _0  (1.626 m), weight 72.6 kg (160 lb), last menstrual period 12/30/2015, SpO2 100 %.Body mass index is 27.46 kg/m.  General Appearance: Disheveled  Eye Contact:  Fair  Speech:   Normal Rate  Volume:  Decreased  Mood:  Anxious and Depressed  Affect:  Depressed  Thought Process:  Goal Directed and Descriptions of Associations: Circumstantial  Orientation:  Full (Time, Place, and Person)  Thought Content:  Hallucinations: Auditory and Rumination hears music that does not stop and frustrates her  Suicidal Thoughts:  No but is hopeless, depressed  Homicidal Thoughts:  No  Memory:  Immediate;   Fair Recent;   Fair Remote;   Fair  Judgement:  Impaired  Insight:  Shallow  Psychomotor Activity:  Normal  Concentration:  Concentration: Fair and Attention Span: Fair  Recall:  [employer] of Knowledge:  Fair  Language:  Fair  Akathisia:  No  Handed:  Right  AIMS (if indicated):     Assets:  Desire for Improvement  ADL's:  Intact  Cognition:  WNL  Sleep:  Number of Hours: 6.75     Treatment Plan Summary:  MDD (major depressive disorder), single episode, severe with psychotic features ([place]) currently acute ([place]). Alcohol use disorder, moderate, dependence. unstable  Will continue today 06/19/16 plan as below except where it is noted.  Daily contact with patient to assess and evaluate symptoms and progress in treatment and Medication management For psychosis: Will continue Risperdal 0.5 mg Q bedtime. Will continue Cogentin 1 mg po or IM for prevention of drug induced EPS..  For affective sx: Will continue Prozac 20 mg po daily.  For Alcohol withdrawal symptoms/detox. Will continue Ativan detox protocols.  For anxiety/agitation: Will continue PRN medications as per agitation protocol. Hydroxyzine 25 mg prn. Haldol 5 mg PO or IM prn.  Insomnia: Will continue Trazodone 100 mg q bedtime.   Will continue to monitor vitals, medication compliance and treatment side effects while patient is here.   Will monitor for medical issues as well as call consult as needed.   Reviewed current lab reports, patient with elevated liver enzymes AST & ALT. I have  reviewed EKG for qtc monitoring- wnl .  CSW will continue working on disposition. Will work on getting collateral information from family.  Patient to participate in therapeutic milieu.  No changes made on the current plan of care. Continue treatment plan already in progress.  Encarnacion Slates, NP, PMHNP, FNP-BC. 06/20/2016, 2:47 PM  I agree to the findings and plan.

## 2016-06-20 NOTE — Progress Notes (Signed)
DAR NOTE: Pt present with bright affect and jovial  mood in the unit. Pt has been observed in the dayroom interacting with peers and staff without problems. Pt denies physical pain, took all her meds as scheduled. As per self inventory, pt had a good night sleep, fair appetite, normal energy, and good concentration. Pt rate depression at 3, hopeless ness at 0, and anxiety at 6. Pt's safety ensured with 15 minute and environmental checks. Pt currently denies SI/HI and A/V hallucinations. Pt verbally agrees to seek staff if SI/HI or A/VH occurs and to consult with staff before acting on these thoughts. Will continue POC.

## 2016-06-20 NOTE — Progress Notes (Signed)
Psychoeducational Group Note  Date:  06/20/2016 Time:  2110  Group Topic/Focus:  Wrap-Up Group:   The focus of this group is to help patients review their daily goal of treatment and discuss progress on daily workbooks.   Participation Level: Did Not Attend  Participation Quality:  Not Applicable  Affect:  Not Applicable  Cognitive:  Not Applicable  Insight:  Not Applicable  Engagement in Group: Not Applicable  Additional Comments:  The patient did not attend group this evening since she remained in her bed.    Eilah Common S 06/20/2016, 9:10 PM

## 2016-06-20 NOTE — Progress Notes (Signed)
D   Pt mostly isolates to her room   She is very focused on being discharged due to her uncle being hospitalized for a heart attack    She does report feeling so much better  But reports feeling very sleepy as well A    Verbal support given   Medications administered and effectiveness monitored    Q 15 min checks R   Pt is safe and receptive to verbal support

## 2016-06-21 LAB — PROLACTIN: PROLACTIN: 121.1 ng/mL — AB (ref 4.8–23.3)

## 2016-06-21 MED ORDER — BENZTROPINE MESYLATE 1 MG PO TABS: 1.0000 mg | ORAL_TABLET | Freq: Three times a day (TID) | ORAL | 0 refills | Status: DC | PRN

## 2016-06-21 MED ORDER — RISPERIDONE 0.5 MG PO TABS: 0.5000 mg | ORAL_TABLET | Freq: Every day | ORAL | 0 refills | Status: DC

## 2016-06-21 MED ORDER — FLUOXETINE HCL 20 MG PO CAPS: 20.0000 mg | ORAL_CAPSULE | Freq: Every day | ORAL | 0 refills | Status: DC

## 2016-06-21 MED ORDER — TRAZODONE HCL 100 MG PO TABS: 100.0000 mg | ORAL_TABLET | Freq: Every day | ORAL | 0 refills | Status: DC

## 2016-06-21 NOTE — Tx Team (Signed)
Interdisciplinary Treatment and Diagnostic Plan Update  06/21/2016 Time of Session: 10:38 AM  [name] MRN: [id number]  Principal Diagnosis: MDD (major depressive disorder), single episode, severe with psychotic features (HCC)  Secondary Diagnoses: Principal Problem:   MDD (major depressive disorder), single episode, severe with psychotic features (H[facility]) Active Problems:   Alcohol use disorder, moderate, dependence (HCC)   Current Medications:  Current Facility-Administered Medications  Medication Dose Route Frequency Provider Last Rate Last Dose  . acetaminophen (TYLENOL) tablet 650 mg  650 mg Oral Q6H PRN Kristeen Mans, NP      . alum & mag hydroxide-simeth (MAALOX/MYLANTA) 200-200-20 MG/5ML suspension 30 mL  30 mL Oral Q4H PRN Kristeen Mans, NP   30 mL at 06/19/16 1046  . benztropine (COGENTIN) tablet 1 mg  1 mg Oral Q8H PRN Jomarie Longs, MD       Or  . benztropine mesylate (COGENTIN) injection 1 mg  1 mg Intramuscular Q8H PRN Jomarie Longs, MD      . FLUoxetine (PROZAC) capsule 20 mg  20 mg Oral Daily Saramma Eappen, MD   20 mg at 06/21/16 0813  . haloperidol (HALDOL) tablet 5 mg  5 mg Oral Q8H PRN Jomarie Longs, MD   5 mg at 06/20/16 1109   Or  . haloperidol lactate (HALDOL) injection 5 mg  5 mg Intramuscular Q8H PRN Jomarie Longs, MD      . Melene Muller ON 06/22/2016] LORazepam (ATIVAN) tablet 1 mg  1 mg Oral Daily Jackelyn Poling, NP      . magnesium hydroxide (MILK OF MAGNESIA) suspension 30 mL  30 mL Oral Daily PRN Kristeen Mans, NP   30 mL at 06/20/16 1712  . multivitamin with minerals tablet 1 tablet  1 tablet Oral Daily Jackelyn Poling, NP   1 tablet at 06/21/16 0813  . pantoprazole (PROTONIX) EC tablet 40 mg  40 mg Oral Daily Jackelyn Poling, NP   40 mg at 06/21/16 0813  . risperiDONE (RISPERDAL) tablet 0.5 mg  0.5 mg Oral QHS Jomarie Longs, MD   0.5 mg at 06/20/16 2132  . thiamine (VITAMIN B-1) tablet 100 mg  100 mg Oral Daily Jackelyn Poling, NP   100 mg at 06/21/16 0813  .  traZODone (DESYREL) tablet 100 mg  100 mg Oral QHS Jomarie Longs, MD   100 mg at 06/20/16 2132    PTA Medications: Prescriptions Prior to Admission  Medication Sig Dispense Refill Last Dose  . diphenhydrAMINE (BENADRYL) 25 MG tablet Take 75 mg by mouth every 6 (six) hours as needed for sleep.   06/16/2016 at Unknown time  . doxylamine, Sleep, (UNISOM) 25 MG tablet Take 25 mg by mouth at bedtime as needed for sleep.   06/16/2016 at Unknown time  . naproxen sodium (ANAPROX) 220 MG tablet Take 220 mg by mouth 2 (two) times daily with a meal.   06/16/2016 at Unknown time    Treatment Modalities: Medication Management, Group therapy, Case management,  1 to 1 session with clinician, Psychoeducation, Recreational therapy.   Physician Treatment Plan for Primary Diagnosis: MDD (major depressive disorder), single episode, severe with psychotic features (HCC) Long Term Goal(s): Improvement in symptoms so as ready for discharge  Short Term Goals: Ability to identify and develop effective coping behaviors will improve   Medication Management: Evaluate patient's response, side effects, and tolerance of medication regimen.  Therapeutic Interventions: 1 to 1 sessions, Unit Group sessions and Medication administration.  Evaluation of Outcomes: Adequate for  Discharge  Physician Treatment Plan for Secondary Diagnosis: Principal Problem:   MDD (major depressive disorder), single episode, severe with psychotic features ([place]) Active Problems:   Alcohol use disorder, moderate, dependence ([place])   Long Term Goal(s): Improvement in symptoms so as ready for discharge  Short Term Goals:  Ability to identify triggers associated with substance abuse/mental health issues will improve  Medication Management: Evaluate patient's response, side effects, and tolerance of medication regimen.  Therapeutic Interventions: 1 to 1 sessions, Unit Group sessions and Medication administration.  Evaluation of Outcomes:  Adequate for Discharge   RN Treatment Plan for Primary Diagnosis: MDD (major depressive disorder), single episode, severe with psychotic features ([place]) Long Term Goal(s): Knowledge of disease and therapeutic regimen to maintain health will improve  Short Term Goals: Ability to identify and develop effective coping behaviors will improve and Compliance with prescribed medications will improve  Medication Management: RN will administer medications as ordered by provider, will assess and evaluate patient's response and provide education to patient for prescribed medication. RN will report any adverse and/or side effects to prescribing provider.  Therapeutic Interventions: 1 on 1 counseling sessions, Psychoeducation, Medication administration, Evaluate responses to treatment, Monitor vital signs and CBGs as ordered, Perform/monitor CIWA, COWS, AIMS and Fall Risk screenings as ordered, Perform wound care treatments as ordered.  Evaluation of Outcomes: Adequate for Discharge   Recreational Therapy Treatment Plan for Primary Diagnosis: MDD (major depressive disorder), single episode, severe with psychotic features ([place]) Long Term Goal(s): LTG- Patient will participate in recreation therapy tx in at least 2 group sessions without prompting from LRT.  Short Term Goals: Patient will be able to identify at least 5 coping skills for admitting dx by conclusion of recreation therapy tx.  Treatment Modalities: Group and Pet Therapy  Therapeutic Interventions: Psychoeducation  Evaluation of Outcomes: Adequate for Discharge     LCSW Treatment Plan for Primary Diagnosis: MDD (major depressive disorder), single episode, severe with psychotic features ([place]) Long Term Goal(s): Safe transition to appropriate next level of care at discharge, Engage patient in therapeutic group addressing interpersonal concerns.  Short Term Goals: Engage patient in aftercare planning with referrals and  resources  Therapeutic Interventions: Assess for all discharge needs, 1 to 1 time with Social worker, Explore available resources and support systems, Assess for adequacy in community support network, Educate family and significant other(s) on suicide prevention, Complete Psychosocial Assessment, Interpersonal group therapy.  Evaluation of Outcomes: Met   Progress in Treatment: Attending groups: Yes Participating in groups: Yes Taking medication as prescribed: Yes Toleration medication: Yes, no side effects reported at this time Family/Significant other contact made: No  Pt did not give permission Patient understands diagnosis: Yes AEB asking for help with depression and psychosis Discussing patient identified problems/goals with staff: Yes Medical problems stabilized or resolved: Yes Denies suicidal/homicidal ideation: Yes Issues/concerns per patient self-inventory: None Other: N/A  New problem(s) identified: None identified at this time.   New Short Term/Long Term Goal(s): None identified at this time.   Discharge Plan or Barriers:  Return home, follow up outpt  Reason for Continuation of Hospitalization:  Estimated Length of Stay: D/C today  Attendees: Patient: 06/21/2016  10:38 AM  Physician: Ursula Alert, MD 06/21/2016  10:38 AM  Nursing: Hoy Register, RN 06/21/2016  10:38 AM  RN Care Manager: Lars Pinks, RN 06/21/2016  10:38 AM  Social Worker: Ripley Fraise 06/21/2016  10:38 AM  Recreational Therapist: Laretta Bolster  06/21/2016  10:38 AM  Other: Norberto Sorenson 06/21/2016  10:38 AM  Other:  06/21/2016  10:38 AM    Scribe for Treatment Team:  Roque Lias LCSW 06/21/2016 10:38 AM

## 2016-06-21 NOTE — Progress Notes (Signed)
Patient discharged to lobby. Patient was stable and appreciative at that time. All papers and prescriptions were given and valuables returned. Verbal understanding expressed. Denies SI/HI and A/VH. Patient given opportunity to express concerns and ask questions.  

## 2016-06-21 NOTE — Progress Notes (Signed)
Recreation Therapy Notes  Date: 06/21/16 Time: 1000 Location: 500 Hall Dayroom  Group Topic: Coping Skills  Goal Area(s) Addresses:  Patients will be able to identify positive coping skills. Patients will be able to identify the benefits of coping skills. Patients will be able to identify how using positive coping skill can help them post d/c.  Behavioral Response: Engaged  Intervention: Environmental consultant, pencils  Activity: Building surveyor.  Patients were to identify the situations that have brought them to the hospital and place them inside the spider web.  Patients were to then identify coping skills that can help them deal with those situations.  Education: Radiographer, therapeutic, Dentist.   Education Outcome: Acknowledges understanding/In group clarification offered/Needs additional education.   Clinical Observations/Feedback:  Pt stated coping skills were "being able to put of with bad or good situations"  Pt expressed some of her struggles as dealing with alcoholism, sadness, loss of control, her children, too much responsibility.  Pt identified some of her coping skills as "put out disrespectful children, don't be a push over at home, find things that bring happiness outside the home and live within budget".  Pt stated using her coping skills with help "take off some of the stress and make it easier to cope".     Victorino Sparrow, LRT/CTRS      Victorino Sparrow A 06/21/2016 12:28 PM

## 2016-06-21 NOTE — Progress Notes (Signed)
Patient ID: [name], female   DOB: 1966/03/23, 50 y.o.   MRN: [id number] PER STATE REGULATIONS 482.30  THIS CHART WAS REVIEWED FOR MEDICAL NECESSITY WITH RESPECT TO THE PATIENT'S ADMISSION/ DURATION OF STAY.  NEXT REVIEW DATE: 06/25/2016  Chauncy Lean, RN, BSN CASE MANAGER

## 2016-06-21 NOTE — BHH Suicide Risk Assessment (Signed)
[facility] Discharge Suicide Risk Assessment   Principal Problem: MDD (major depressive disorder), single episode, severe with psychotic features [facility]) Discharge Diagnoses:  Patient Active Problem List   Diagnosis Date Noted  . Alcohol use disorder, moderate, dependence ([place]) [F10.20] 06/18/2016  . MDD (major depressive disorder), single episode, severe with psychotic features ([place]) [F32.3] 06/17/2016  . Genetic testing [Z13.79] 03/28/2016  . 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 Family history of colon cancer [Z80.0] 10/16/2015  . Family history of ovarian cancer [Z80.41] 10/16/2015  . Family history of breast cancer in female [Z80.3] 10/16/2015  . History of colonic polyps [Z86.010] 10/16/2015    Total Time spent with patient: 30 minutes  Musculoskeletal: Strength & Muscle Tone: within normal limits Gait & Station: normal Patient leans: N/A  Psychiatric Specialty Exam: ROS no headache, no chest pain, no shortness of breath, no nausea or vomiting, no rash  Blood pressure 107/69, pulse 88, temperature 98.3 F (36.8 C), temperature source Oral, resp. rate 16, height _0  (1.626 m), weight 160 lb (72.6 kg), last menstrual period 12/30/2015, SpO2 100 %.Body mass index is 27.46 kg/m.  General Appearance: Well Groomed  Eye Contact::  Good  Speech:  Normal Rate409  Volume:  Normal  Mood:  denies depression, states " my mood is good today"  Affect:  Appropriate  Thought Process:  Linear  Orientation:  Full (Time, Place, and Person)  Thought Content:  Negative- no hallucinations, no delusions, not internally preoccupied   Suicidal Thoughts:  No denies any suicidal or self injurious ideations, no homicidal or violent ideations   Homicidal Thoughts:  No  Memory:  recent and remote grossly intact   Judgement:  Other:  improving   Insight:  improving   Psychomotor Activity:  Normal  Concentration:  Good  Recall:  Good  Fund of Knowledge:Good  Language: Good   Akathisia:  Negative  Handed:  Right  AIMS (if indicated):   no abnormal or involuntary movements noted or reported   Assets:  Desire for Improvement Resilience  Sleep:  Number of Hours: 6.5  Cognition: WNL  ADL's:  Intact   Mental Status Per Nursing Assessment::   On Admission:  NA  Demographic Factors:  50 year old female, lives with fiance, 4 children. On disability.  Loss Factors: Difficulties with 58 year old son.  Historical Factors: One prior admission for alcohol/substance abuse . History of alcohol abuse, had recently relapsed .  Risk Reduction Factors:   Responsible for children under 57 years of age, Sense of responsibility to family, Living with another person, especially a relative and Positive coping skills or problem solving skills  Continued Clinical Symptoms:  At this time patient is improved compared to admission- presents alert, attentive, well related, reports improved mood , denies feeling depressed , affect is appropriate, no thought disorder, no suicidal or self injurious ideations, no homicidal or violent ideations, denies hallucinations , no delusions, not internally preoccupied, future oriented, looking forward to going home and seeing her family. Denies any alcohol cravings , no residual withdrawal symptoms. No medication side effects- side effects,to include risk of movement disorders, TD, metabolic disturbances, reviewed .  Cognitive Features That Contribute To Risk:  Closed-mindedness and Loss of executive function    Suicide Risk:  Mild:  Suicidal ideation of limited frequency, intensity, duration, and specificity.  There are no identifiable plans, no associated intent, mild dysphoria and related symptoms, good self-control (both objective and subjective assessment), few other risk factors, and identifiable protective factors,  including available and accessible social support.  Follow-up Information    [facility] Follow up on 06/23/2016.    Why:  Wednesday at 1:00 for your hospital follow up appointment Contact information: [place] [phone]          Plan Of Care/Follow-up recommendations:  Activity:  as tolerated  Diet:  regular Tests:  NA Other:  See below   Patient is requesting discharge, and there are no grounds for involuntary commitment at this time She is leaving in good spirits  Plans to return home Plans to follow up as above. Has an established PCP , Dr. Carole Binning [facility]) for medical issues as needed   Neita Garnet, MD 06/21/2016, 10:42 AM

## 2016-06-21 NOTE — Plan of Care (Signed)
Problem: [facility] Participation in Recreation Therapeutic Interventions Goal: STG-Patient will identify at least five coping skills for ** STG: Coping Skills - Patient will be able to identify at least 5 coping skills for substance abuse by conclusion of recreation therapy tx   Outcome: Completed/Met Date Met: 06/21/16 Pt was able to identify coping skills after completion of coping skills recreation therapy session.  Victorino Sparrow, LRT/CTRS

## 2016-06-21 NOTE — Discharge Summary (Signed)
Physician Discharge Summary Note  Patient:  [name] is an 50 y.o., female MRN:  [id number] DOB:  Jan 23, 1966 Patient phone:  [phone] (home)  Patient address:   [place] [address],  Total Time spent with patient: 30 minutes  Date of Admission:  06/17/2016 Date of Discharge: 06/21/2016  Reason for Admission:PER H&P- [name] a 15 y.o.AA female, who is married , on SSD for medical reasons ( ankle fracture - s/p fusion surgery) , who lives in [place] , with her family , has a hx of alcohol abuse as well as depression, presented to [facility]  With worsening depression and AH.Per initial notes in EHR : "Pt reports hearing music in her right ear. Pt states she hears the same song over and over in her ear. Pt states itBoyles affecting her sleep and ability to hear other things. Pt states the music is not commanding any behaviors.Pt reports a lot of stress in her life. Pt states she is overwhelmed because of ongoing family issues. Pt states her husband and children do not respect her. Pt denies current mental health treatment. Pt reports inpatient treatment 20 years ago ."Patient seen and chart reviewed TODAY .Discussed patient with treatment team. Patient today is seen as depressed , withdrawn . Pt reports she has ongoing sadness, low energy , insomnia since the past 2 years - worsening since the past 2 weeks. Pt reports worsening AH since the past several days - reports its frustrating - she watched TV and when she goes to another room , the same music keeps playing over and over again and she cannot stop it . Pt reports it is in her right sided ear and its playing as though from a radio. Pt reports she several psychosocial stressors - her husband is always irritable , talks to her in a loud voice , verbally abusive , her son who is 52 y has schizophrenia , she is his sole caretaker and he follows her around even to the bathroom, her other son is abusing drugs and has legal issues ,  bringing home girls and that frustrates her . Pt reports she was trying some OTC sleep aids which did not help. Pt reports abusing alcohol since the holidays started - but states it is not out of control and that she can stop it if she wants to. Pt denies any withdrawal sx. Pt reports past hx of admission to [facility] several years ago for alcohol abuse and was then transferred to another treatment facility. Pt reports she is willing to go to NA /AA meetings this time  Principal Problem: MDD (major depressive disorder), single episode, severe with psychotic features [facility]) Discharge Diagnoses: Patient Active Problem List   Diagnosis Date Noted  . Alcohol use disorder, moderate, dependence ([place]) [F10.20] 06/18/2016  . MDD (major depressive disorder), single episode, severe with psychotic features ([place]) [F32.3] 06/17/2016  . Genetic testing [Z13.79] 03/28/2016  . 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 Family history of colon cancer [Z80.0] 10/16/2015  . Family history of ovarian cancer [Z80.41] 10/16/2015  . Family history of breast cancer in female [Z80.3] 10/16/2015  . History of colonic polyps [Z86.010] 10/16/2015    Past Psychiatric History:   Past Medical History:  Past Medical History:  Diagnosis Date  . Depression    no meds currently  . GERD (gastroesophageal reflux disease)   . MLH1-related Lynch syndrome (HNPCC2)   . Seizures ([place])    Only one back  in 2004, none since, no meds, very stressed, patient just lost her husband  . SVD (spontaneous vaginal delivery)    x 4    Past Surgical History:  Procedure Laterality Date  . ANKLE SURGERY Right 2001   skin graft and bone replacment  . CO2 LASER APPLICATION    . COLONOSCOPY WITH PROPOFOL N/A 02/09/2016   Procedure: COLONOSCOPY WITH PROPOFOL;  Surgeon: Garlan Fair, MD;  Location: [facility];  Service: Endoscopy;  Laterality: N/A;  . ESOPHAGOGASTRODUODENOSCOPY (EGD) WITH PROPOFOL N/A 02/09/2016    Procedure: ESOPHAGOGASTRODUODENOSCOPY (EGD) WITH PROPOFOL;  Surgeon: Garlan Fair, MD;  Location: [facility];  Service: Endoscopy;  Laterality: N/A;  . right arm surgery     x 2   . TUBAL LIGATION    . WISDOM TOOTH EXTRACTION     Family History:  Family History  Problem Relation Age of Onset  . Other Mother     hx of TAH-BSO in her late 20s-30 for unspecified cause  . Colon cancer Father     dx. early-mid-40s with later "recurrence"  . Ovarian cancer Sister 87  . Colon cancer Brother     dx. 19s; +surgery and chemotherapy  . Colon cancer Paternal Aunt 41  . Colon cancer Paternal Uncle 82  . Cancer Maternal Grandmother     NOS cancer, d. 27s  . Breast cancer Paternal Grandmother     d. early 68s  . Colon polyps Other     "many"; dx. late 48s or younger  . Other Daughter     breast issues--infection and fluid had to be removed; also has issues with on-going periods; dx. 15 or younger  . Other Daughter     cyst on her fallopian tube, being monitored; dx. 90 or younger  . Other Brother     issues with recent illness; bloodwork to check for cancer - age 31  . Other Brother     issues with swollen prostate; GI issues; age 29  . Cancer Cousin 16    paternal 1st cousin dx. "bowel" cancer  . Colon cancer Cousin 85    paternal 1st cousin  . Colon cancer Cousin 60    paternal 1st cousin  . Colon cancer Cousin 48    paternal 1st cousin  . Colon cancer Cousin     paternal 1st cousin at unspecified age  . Colon cancer Cousin 46    daughter of affected paternal 1st cousin  . Uterine cancer Cousin     paternal 1st cousin, d. 74s  . Schizophrenia Son   . Drug abuse Son   . Cancer Brother     paternal half-brother dx. in his 68s; NOS cancer  . Prostate cancer Brother     paternal half-brother d. 62s   Family Psychiatric  History: Social History:  History  Alcohol Use  . Yes    Comment: hx of EtOH abuse; only drinks twice a yr now, but then "drinks heavy"     History   Drug Use No    Social History   Social History  . Marital status: Married    Spouse name: N/A  . Number of children: N/A  . Years of education: N/A   Social History Main Topics  . Smoking status: Never Smoker  . Smokeless tobacco: Never Used  . Alcohol use Yes     Comment: hx of EtOH abuse; only drinks twice a yr now, but then "drinks heavy"  . Drug use: No  .  Sexual activity: Not Asked   Other Topics Concern  . None   Social History Narrative  . None    Hospital Course:  MARIELLEN BLANEY was admitted for MDD (major depressive disorder), single episode, severe with psychotic features ([place]) and crisis management.  Pt was treated discharged with the medications listed below under Medication List.  Medical problems were identified and treated as needed.  Home medications were restarted as appropriate.  Improvement was monitored by observation and Beth Boyles daily report of symptom reduction.  Emotional and mental status was monitored by daily self-inventory reports completed by [name] and clinical staff.         [name] was evaluated by the treatment team for stability and plans for continued recovery upon discharge. [name] 's motivation was an integral factor for scheduling further treatment. Employment, transportation, bed availability, health status, family support, and any pending legal issues were also considered during hospital stay. Pt was offered further treatment options upon discharge including but not limited to Residential, Intensive Outpatient, and Outpatient treatment.  [name] will follow up with the services as listed below under Follow Up Information.     Upon completion of this admission the patient was both mentally and medically stable for discharge denying suicidal/homicidal ideation, auditory/visual/tactile hallucinations, delusional thoughts and paranoia.   [name] responded well to treatment with cogentin and Risperdal and  trazdone. Pt demonstrated improvement without reported or observed adverse effects to the point of stability appropriate for outpatient management. Pertinent labs include: Hepatic function panel, CBC and CMP for which outpatient follow-up is necessary for lab recheck as mentioned below. Reviewed CBC, CMP, BAL, and UDS; all unremarkable aside from noted exceptions.   Physical Findings: AIMS: Facial and Oral Movements Muscles of Facial Expression: None, normal Lips and Perioral Area: None, normal Jaw: None, normal Tongue: None, normal,Extremity Movements Upper (arms, wrists, hands, fingers): None, normal Lower (legs, knees, ankles, toes): None, normal, Trunk Movements Neck, shoulders, hips: None, normal, Overall Severity Severity of abnormal movements (highest score from questions above): None, normal Incapacitation due to abnormal movements: None, normal PatientBoyles awareness of abnormal movements (rate only patientBoyles report): No Awareness, Dental Status Current problems with teeth and/or dentures?: No Does patient usually wear dentures?: No  CIWA:  CIWA-Ar Total: 0 COWS:     Musculoskeletal: Strength & Muscle Tone: within normal limits Gait & Station: normal Patient leans: N/A  Psychiatric Specialty Exam: See SRA By MD Physical Exam  Nursing note and vitals reviewed. Cardiovascular: Normal rate.   Psychiatric: She has a normal mood and affect. Her behavior is normal.    Review of Systems  Psychiatric/Behavioral: Negative for depression (stable) and hallucinations. Nervous/anxious: stable.     Blood pressure 107/69, pulse 88, temperature 98.3 F (36.8 C), temperature source Oral, resp. rate 16, height '5\' 4"'$  (1.626 m), weight 72.6 kg (160 lb), last menstrual period 12/30/2015, SpO2 100 %.Body mass index is 27.46 kg/m.    Have you used any form of tobacco in the last 30 days? (Cigarettes, Smokeless Tobacco, Cigars, and/or Pipes): No  Has this patient used any form of tobacco in the  last 30 days? (Cigarettes, Smokeless Tobacco, Cigars, and/or Pipes) No  Blood Alcohol level:  Lab Results  Component Value Date   ETH <5 06/17/2016   ETH  09/07/2008    <5        LOWEST DETECTABLE LIMIT FOR SERUM ALCOHOL IS 5 mg/dL FOR MEDICAL PURPOSES ONLY  Metabolic Disorder Labs:  Lab Results  Component Value Date   HGBA1C 4.8 06/19/2016   MPG 91 06/19/2016   No results found for: PROLACTIN Lab Results  Component Value Date   CHOL 183 06/19/2016   TRIG 62 06/19/2016   HDL 111 06/19/2016   CHOLHDL 1.6 06/19/2016   VLDL 12 06/19/2016   LDLCALC 60 06/19/2016    See Psychiatric Specialty Exam and Suicide Risk Assessment completed by Attending Physician prior to discharge.  Discharge destination:  Home  Is patient on multiple antipsychotic therapies at discharge:  No   Has Patient had three or more failed trials of antipsychotic monotherapy by history:  No  Recommended Plan for Multiple Antipsychotic Therapies: NA  Discharge Instructions    Diet - low sodium heart healthy    Complete by:  As directed    Discharge instructions    Complete by:  As directed    Take all medications as prescribed. Keep all follow-up appointments as scheduled.  Do not consume alcohol or use illegal drugs while on prescription medications. Report any adverse effects from your medications to your primary care provider promptly.  In the event of recurrent symptoms or worsening symptoms, call 911, a crisis hotline, or go to the nearest emergency department for evaluation.   Increase activity slowly    Complete by:  As directed        Medication List    STOP taking these medications   diphenhydrAMINE 25 MG tablet Commonly known as:  BENADRYL   doxylamine (Sleep) 25 MG tablet Commonly known as:  UNISOM   naproxen sodium 220 MG tablet Commonly known as:  ANAPROX     TAKE these medications     Indication  benztropine 1 MG tablet Commonly known as:  COGENTIN Take 1 tablet (1  mg total) by mouth every 8 (eight) hours as needed for tremors (for eps, to be given with haldol).  Indication:  Extrapyramidal Reaction caused by Medications   FLUoxetine 20 MG capsule Commonly known as:  PROZAC Take 1 capsule (20 mg total) by mouth daily. Start taking on:  06/22/2016  Indication:  Depression   risperiDONE 0.5 MG tablet Commonly known as:  RISPERDAL Take 1 tablet (0.5 mg total) by mouth at bedtime.  Indication:  Psychosis   traZODone 100 MG tablet Commonly known as:  DESYREL Take 1 tablet (100 mg total) by mouth at bedtime.  Indication:  Trouble Sleeping      Follow-up Information    [facility] Follow up on 06/23/2016.   Why:  Wednesday at 1:00 for your hospital follow up appointment Contact information: [place] [phone]          Follow-up recommendations:  Activity:  as tolerated Diet:  heart healthy  Comments: Take all medications as prescribed. Keep all follow-up appointments as scheduled.  Do not consume alcohol or use illegal drugs while on prescription medications. Report any adverse effects from your medications to your primary care provider promptly.  In the event of recurrent symptoms or worsening symptoms, call 911, a crisis hotline, or go to the nearest emergency department for evaluation.   Signed: Derrill Center, NP 06/21/2016, 10:24 AM   Patient seen, Suicide Assessment Completed.  Disposition Plan Reviewed

## 2016-06-21 NOTE — Progress Notes (Signed)
  [facility] Adult Case Management Discharge Plan :  Will you be returning to the same living situation after discharge:  Yes,  home At discharge, do you have transportation home?: Yes,  family Do you have the ability to pay for your medications: Yes,  MCD  Release of information consent forms completed and in the chart;  Patient's signature needed at discharge.  Patient to Follow up at: Follow-up Information    [facility] Follow up on 06/23/2016.   Why:  Wednesday at 1:00 for your hospital follow up appointment Contact information: [place] [phone]          Next level of care provider has access to [place] and Suicide Prevention discussed: Yes,  yes  Have you used any form of tobacco in the last 30 days? (Cigarettes, Smokeless Tobacco, Cigars, and/or Pipes): No  Has patient been referred to the Quitline?: N/A patient is not a smoker  Patient has been referred for addiction treatment: Yes  Trish Mage 06/21/2016, 10:39 AM

## 2016-07-18 ENCOUNTER — Other Ambulatory Visit (HOSPITAL_COMMUNITY): Payer: Self-pay | Admitting: Psychiatry

## 2016-07-20 ENCOUNTER — Other Ambulatory Visit (HOSPITAL_COMMUNITY): Payer: Self-pay | Admitting: Psychiatry

## 2016-07-22 ENCOUNTER — Encounter (HOSPITAL_COMMUNITY): Admission: AD | Payer: Self-pay | Source: Ambulatory Visit

## 2016-07-22 ENCOUNTER — Other Ambulatory Visit (HOSPITAL_COMMUNITY): Payer: Self-pay | Admitting: Psychiatry

## 2016-07-22 ENCOUNTER — Ambulatory Visit (HOSPITAL_COMMUNITY)
Admission: AD | Admit: 2016-07-22 | Payer: Medicare Other | Source: Ambulatory Visit | Admitting: Obstetrics and Gynecology

## 2016-07-22 SURGERY — HYSTERECTOMY, VAGINAL, LAPAROSCOPY-ASSISTED, WITH SALPINGO-OOPHORECTOMY
Anesthesia: General | Laterality: Bilateral

## 2016-08-08 ENCOUNTER — Other Ambulatory Visit (HOSPITAL_COMMUNITY): Payer: Self-pay | Admitting: Psychiatry

## 2016-11-03 ENCOUNTER — Other Ambulatory Visit: Payer: Self-pay | Admitting: Obstetrics and Gynecology

## 2016-11-03 DIAGNOSIS — N6320 Unspecified lump in the left breast, unspecified quadrant: Secondary | ICD-10-CM

## 2016-11-12 ENCOUNTER — Other Ambulatory Visit: Payer: Self-pay

## 2016-12-05 ENCOUNTER — Emergency Department (HOSPITAL_COMMUNITY)
Admission: EM | Admit: 2016-12-05 | Discharge: 2016-12-05 | Disposition: A | Discharge status: Home / Self Care | Payer: Medicare Other | Attending: Emergency Medicine | Admitting: Emergency Medicine

## 2016-12-05 ENCOUNTER — Encounter (HOSPITAL_COMMUNITY): Payer: Self-pay | Admitting: Emergency Medicine

## 2016-12-05 ENCOUNTER — Emergency Department (HOSPITAL_COMMUNITY): Payer: Medicare Other

## 2016-12-05 DIAGNOSIS — R5383 Other fatigue: Secondary | ICD-10-CM | POA: Diagnosis not present

## 2016-12-05 DIAGNOSIS — R945 Abnormal results of liver function studies: Secondary | ICD-10-CM | POA: Diagnosis not present

## 2016-12-05 DIAGNOSIS — J069 Acute upper respiratory infection, unspecified: Secondary | ICD-10-CM | POA: Diagnosis not present

## 2016-12-05 DIAGNOSIS — R05 Cough: Secondary | ICD-10-CM | POA: Diagnosis present

## 2016-12-05 DIAGNOSIS — B9789 Other viral agents as the cause of diseases classified elsewhere: Secondary | ICD-10-CM

## 2016-12-05 DIAGNOSIS — R7989 Other specified abnormal findings of blood chemistry: Secondary | ICD-10-CM

## 2016-12-05 LAB — CBC
HCT: 39.7 % (ref 36.0–46.0)
HEMOGLOBIN: 12.8 g/dL (ref 12.0–15.0)
MCH: 30 pg (ref 26.0–34.0)
MCHC: 32.2 g/dL (ref 30.0–36.0)
MCV: 93.2 fL (ref 78.0–100.0)
Platelets: 188 10*3/uL (ref 150–400)
RBC: 4.26 MIL/uL (ref 3.87–5.11)
RDW: 16.3 % — ABNORMAL HIGH (ref 11.5–15.5)
WBC: 5.1 10*3/uL (ref 4.0–10.5)

## 2016-12-05 LAB — COMPREHENSIVE METABOLIC PANEL
ALK PHOS: 66 U/L (ref 38–126)
ALT: 85 U/L — ABNORMAL HIGH (ref 14–54)
ANION GAP: 7 (ref 5–15)
AST: 111 U/L — ABNORMAL HIGH (ref 15–41)
Albumin: 4.3 g/dL (ref 3.5–5.0)
BILIRUBIN TOTAL: 0.7 mg/dL (ref 0.3–1.2)
BUN: 10 mg/dL (ref 6–20)
CALCIUM: 9.1 mg/dL (ref 8.9–10.3)
CO2: 24 mmol/L (ref 22–32)
Chloride: 104 mmol/L (ref 101–111)
Creatinine, Ser: 0.7 mg/dL (ref 0.44–1.00)
GLUCOSE: 100 mg/dL — AB (ref 65–99)
Potassium: 3.1 mmol/L — ABNORMAL LOW (ref 3.5–5.1)
Sodium: 135 mmol/L (ref 135–145)
TOTAL PROTEIN: 8 g/dL (ref 6.5–8.1)

## 2016-12-05 LAB — URINALYSIS, ROUTINE W REFLEX MICROSCOPIC
Bacteria, UA: NONE SEEN
GLUCOSE, UA: NEGATIVE mg/dL
HGB URINE DIPSTICK: NEGATIVE
Ketones, ur: 5 mg/dL — AB
NITRITE: NEGATIVE
Protein, ur: NEGATIVE mg/dL
Specific Gravity, Urine: 1.027 (ref 1.005–1.030)
pH: 5 (ref 5.0–8.0)

## 2016-12-05 LAB — LIPASE, BLOOD: LIPASE: 47 U/L (ref 11–51)

## 2016-12-05 MED ORDER — PROMETHAZINE-DM 6.25-15 MG/5ML PO SYRP: 5.0000 mL | ORAL_SOLUTION | Freq: Three times a day (TID) | ORAL | 0 refills | Status: DC | PRN

## 2016-12-05 NOTE — ED Triage Notes (Signed)
Patient here with complaints of generalized weakness and cough x4 days. Reports "I would like my liver checked ". Denies fever.

## 2016-12-05 NOTE — ED Provider Notes (Signed)
[facility] DEPT Provider Note   CSN: [id number] Arrival date & time: 12/05/16  1043     History   Chief Complaint Chief Complaint  Patient presents with  . Weakness  . Fatigue  . Cough    HPI [name] is a 51 y.o. female.  51 year old female with past medical history including depression, GERD, Lynch syndrome, alcohol use, liver problems who presents with cough and fatigue. She reports 4 days of dry cough associated with sinus drainage. She has tried OTC medications with no relief. She has had associated diarrhea, loss of appetite, and fatigue. No fevers or vomiting. Son currently ill with similar symptoms.  Mild SOB when laying flat. No chest pain.  She also requests her liver checked because she has been diagnosed with liver disease in the past and has not been evaluated recently. She occasionally drinks heavily but she denies any daily alcohol use. She recalls maybe being diagnosed with hepatitis C in the past.   The history is provided by the patient.  Weakness   Cough     Past Medical History:  Diagnosis Date  . Depression    no meds currently  . GERD (gastroesophageal reflux disease)   . MLH1-related Lynch syndrome (HNPCC2)   . Seizures ([place])    Only one back in 2004, none since, no meds, very stressed, patient just lost her husband  . SVD (spontaneous vaginal delivery)    x 4    Patient Active Problem List   Diagnosis Date Noted  . Alcohol use disorder, moderate, dependence ([place]) 06/18/2016  . MDD (major depressive disorder), single episode, severe with psychotic features ([place]) 06/17/2016  . Genetic testing 03/28/2016  . MLH1-related Lynch syndrome (HNPCC2) 03/28/2016  . Family history of colon cancer 10/16/2015  . Family history of ovarian cancer 10/16/2015  . Family history of breast cancer in female 10/16/2015  . History of colonic polyps 10/16/2015    Past Surgical History:  Procedure Laterality Date  . ANKLE SURGERY Right 2001   skin  graft and bone replacment  . CO2 LASER APPLICATION    . COLONOSCOPY WITH PROPOFOL N/A 02/09/2016   Procedure: COLONOSCOPY WITH PROPOFOL;  Surgeon: Garlan Fair, MD;  Location: [facility];  Service: Endoscopy;  Laterality: N/A;  . ESOPHAGOGASTRODUODENOSCOPY (EGD) WITH PROPOFOL N/A 02/09/2016   Procedure: ESOPHAGOGASTRODUODENOSCOPY (EGD) WITH PROPOFOL;  Surgeon: Garlan Fair, MD;  Location: [facility];  Service: Endoscopy;  Laterality: N/A;  . right arm surgery     x 2   . TUBAL LIGATION    . WISDOM TOOTH EXTRACTION      OB History    No data available       Home Medications    Prior to Admission medications   Medication Sig Start Date End Date Taking? Authorizing Provider  benztropine (COGENTIN) 1 MG tablet Take 1 tablet (1 mg total) by mouth every 8 (eight) hours as needed for tremors (for eps, to be given with haldol). 06/21/16   Derrill Center, NP  FLUoxetine (PROZAC) 20 MG capsule Take 1 capsule (20 mg total) by mouth daily. 06/22/16   Derrill Center, NP  risperiDONE (RISPERDAL) 0.5 MG tablet Take 1 tablet (0.5 mg total) by mouth at bedtime. 06/21/16   Derrill Center, NP  traZODone (DESYREL) 100 MG tablet Take 1 tablet (100 mg total) by mouth at bedtime. 06/21/16   Derrill Center, NP    Family History Family History  Problem Relation Age of Onset  .  Other Mother        hx of TAH-BSO in her late 20s-30 for unspecified cause  . Colon cancer Father        dx. early-mid-40s with later "recurrence"  . Ovarian cancer Sister 41  . Colon cancer Brother        dx. 85s; +surgery and chemotherapy  . Colon cancer Paternal Aunt 19  . Colon cancer Paternal Uncle 3  . Cancer Maternal Grandmother        NOS cancer, d. 65s  . Breast cancer Paternal Grandmother        d. early 64s  . Colon polyps Other        "many"; dx. late 82s or younger  . Other Daughter        breast issues--infection and fluid had to be removed; also has issues with on-going periods; dx. 15 or younger   . Other Daughter        cyst on her fallopian tube, being monitored; dx. 13 or younger  . Other Brother        issues with recent illness; bloodwork to check for cancer - age 31  . Other Brother        issues with swollen prostate; GI issues; age 9  . Cancer Cousin 49       paternal 1st cousin dx. "bowel" cancer  . Colon cancer Cousin 4       paternal 1st cousin  . Colon cancer Cousin 61       paternal 1st cousin  . Colon cancer Cousin 58       paternal 1st cousin  . Colon cancer Cousin        paternal 1st cousin at unspecified age  . Colon cancer Cousin 21       daughter of affected paternal 1st cousin  . Uterine cancer Cousin        paternal 1st cousin, d. 87s  . Schizophrenia Son   . Drug abuse Son   . Cancer Brother        paternal half-brother dx. in his 64s; NOS cancer  . Prostate cancer Brother        paternal half-brother d. 62s    Social History Social History  Substance Use Topics  . Smoking status: Never Smoker  . Smokeless tobacco: Never Used  . Alcohol use Yes     Comment: hx of EtOH abuse; only drinks twice a yr now, but then "drinks heavy"     Allergies   Patient has no known allergies.   Review of Systems Review of Systems  Respiratory: Positive for cough.   Neurological: Positive for weakness.  All other systems reviewed and are negative except that which was mentioned in HPI   Physical Exam Updated Vital Signs BP (!) 146/83 (BP Location: Right Arm)   Pulse 68   Temp 97.8 F (36.6 C) (Oral)   Resp 20   LMP 12/30/2015 (Approximate)   SpO2 99%   Physical Exam  Constitutional: She is oriented to person, place, and time. She appears well-developed and well-nourished. No distress.  HENT:  Head: Normocephalic and atraumatic.  Moist mucous membranes Mild erythema of posterior oropharynx  Eyes: Conjunctivae are normal. Pupils are equal, round, and reactive to light.  Neck: Neck supple.  Cardiovascular: Normal rate, regular rhythm and  normal heart sounds.   No murmur heard. Pulmonary/Chest: Effort normal and breath sounds normal.  Abdominal: Soft. Bowel sounds are normal. She exhibits no distension. There is  no tenderness.  Musculoskeletal: She exhibits no edema.  Neurological: She is alert and oriented to person, place, and time.  Fluent speech  Skin: Skin is warm and dry.  Psychiatric: Judgment normal.  Depressed mood  Nursing note and vitals reviewed.    ED Treatments / Results  Labs (all labs ordered are listed, but only abnormal results are displayed) Labs Reviewed  LIPASE, BLOOD  COMPREHENSIVE METABOLIC PANEL  CBC  URINALYSIS, ROUTINE W REFLEX MICROSCOPIC    EKG  EKG Interpretation None       Radiology No results found.  Procedures Procedures (including critical care time)  Medications Ordered in ED Medications - No data to display   Initial Impression / Assessment and Plan / ED Course  I have reviewed the triage vital signs and the nursing notes.  Pertinent labs & imaging results that were available during my care of the patient were reviewed by me and considered in my medical decision making (see chart for details).     Pt With several days of viral symptoms including cough, congestion, and postnasal drip. Vital signs unremarkable here, clear breath sounds on exam. Chest x-ray negative acute. Lab work here is reassuring against dehydration. She denies any significant shortness of breath or chest pain. I've discussed supportive measures at home and provided her with cough medication. Regarding her liver studies, AST 111, ALT 85 with normal bilirubin. It appears that her lab work in the past has been much higher in 2012 and today's labs are similar to recent labs. She does not have any symptoms of acute hepatitis but I have emphasized the importance of establishing care with a hepatologist. She will follow-up with PCP for referral. She understands return precautions. Patient discharged in  satisfactory condition.  Final Clinical Impressions(s) / ED Diagnoses   Final diagnoses:  None    New Prescriptions New Prescriptions   No medications on file     Little, Wenda Overland, MD 12/05/16 1746

## 2016-12-30 ENCOUNTER — Encounter (HOSPITAL_COMMUNITY): Payer: Self-pay

## 2017-02-22 NOTE — H&P (Signed)
NAMEKELLEN, HOVER NO.:  [id number]  MEDICAL RECORD NO.:  [id number]  LOCATION:                                 FACILITY:  PHYSICIAN:  [name], M.D.   DATE OF BIRTH:  Dec 09, 1965  DATE OF ADMISSION: DATE OF DISCHARGE:                             HISTORY & PHYSICAL   Date of her surgery is March 17, 2017, at [facility] [place].  HISTORY OF PRESENT ILLNESS:  The patient is a 51 year old, gravida 5, para 43 female comes in for laparoscopic-assisted vaginal hysterectomy with removal of both tubes and ovaries.  In relation to the present admission, the patient has been experiencing trouble with increasing abdominal pain and discomfort associated with abnormal bleeding.  She came in for a saline infusion ultrasound that revealed a uterus enlarged secondary to adenomyosis.  She had a small complex cyst in the right ovary for a few internal echoes.  Endometrial sampling was performed. We felt that her abdominal pain, bloatedness, and abnormal bleeding were related to the adenomyosis.  Options were discussed.  She now proceeds with definitive surgery.  ALLERGIES:  She has no known drug allergies.  MEDICATIONS:  Just over-the-counter medication.  PAST MEDICAL HISTORY:  She had usual childhood diseases without any significant sequelae.  Had a history of irritable bowel syndrome, abnormal Pap smears in the past.  She states she has been hypertensive, but list no medication for that.  PAST SURGICAL HISTORY:  She has had leg surgery done and has had a bilateral tubal ligation.  She does have a history of past alcohol abuse and was hospitalized for liver issues.  She seems to be doing well with that at the present time.  FAMILY HISTORY:  Noncontributory.  SOCIAL HISTORY:  At this point in time, reveals no tobacco or alcohol use.  REVIEW OF SYSTEMS:  Noncontributory.  PHYSICAL EXAMINATION:  VITAL SIGNS:  The patient is afebrile with  stable vital signs. HEENT:  The patient is normocephalic.  Pupils equal, round, and reactive to light and accommodation.  Extraocular movements are intact.  Sclerae and conjunctiva clear.  Oropharynx clear. NECK:  Without thyromegaly. BREASTS:  No discrete masses. LUNGS:  Clear. CARDIOVASCULAR:  Regular rhythm and rate without murmurs or gallops.  No carotid or abdominal bruits. ABDOMEN:  Benign.  No mass, organomegaly, or tenderness. PELVIC:  Normal external genitalia.  Vaginal mucosa is clear.  Cervix unremarkable.  Uterus normal size, shape, and contour.  Adnexa free of masses or tenderness. EXTREMITIES:  Trace edema. NEUROLOGIC:  Grossly within limits.  IMPRESSION: 1. Abnormal uterine bleeding, pelvic pain secondary to adenomyosis. 2. The patient with a mutation for Lynch syndrome. 3. History of abnormal cervical cytology.  PLAN:  The patient will undergo laparoscopic-assisted vaginal hysterectomy with removal of both tubes and ovaries.  The risks of surgery have been discussed including the risk of infection.  The risk of hemorrhage that could require transfusion with the risk of AIDS or hepatitis.  Risk of injury to adjacent organs including bladder, bowel, ureters, which could require further exploratory surgery.  Risk of deep venous thrombosis and pulmonary embolus.  The patient expressed understanding of  the indications and risks.     [name], M.D.     JSM/MEDQ  D:  02/22/2017  T:  02/22/2017  Job:  [id number]

## 2017-02-22 NOTE — H&P (Signed)
Patient name [name], [name] DICTATION# [id number] CSN# [id number]  [facility], MD 02/22/2017 8:50 AM

## 2017-03-14 NOTE — Patient Instructions (Addendum)
[name]  03/14/2017      Your procedure is scheduled on 03-17-17  Report to [place]  at  6A.M.  Call this number if you have problems the morning of surgery:[phone]  OUR ADDRESS IS [place], WE ARE LOCATED [facility] THE [facility] WITH [facility].                           REMOVE 1 ACRYLIC NAIL  Remember:  Do not eat food or drink liquids after midnight.  Take these medicines the morning of surgery with A SIP OF WATER: NONE   Do not wear jewelry, make-up or nail polish.  Do not wear lotions, powders, or perfumes, or deoderant.  Do not shave 48 hours prior to surgery.  Men may shave face and neck.  Do not bring valuables to the hospital.  [facility] is not responsible for any belongings or valuables.  Contacts, dentures or bridgework may not be worn into surgery.  Instructions  dr Saul Fordyce you will be admitted to hospital after surgery  Special instructions:  N/A  Please read over the following fact sheets that you were given:      [facility] - Preparing for Surgery Before surgery, you can play an important role.  Because skin is not sterile, your skin needs to be as free of germs as possible.  You can reduce the number of germs on your skin by washing with CHG (chlorahexidine gluconate) soap before surgery.  CHG is an antiseptic cleaner which kills germs and bonds with the skin to continue killing germs even after washing. Please DO NOT use if you have an allergy to CHG or antibacterial soaps.  If your skin becomes reddened/irritated stop using the CHG and inform your nurse when you arrive at Short Stay. Do not shave (including legs and underarms) for at least 48 hours prior to the first CHG shower.  You may shave your face/neck. Please follow these instructions carefully:  1.  Shower with CHG Soap the night before surgery and the  morning of Surgery.  2.  If you choose to wash your hair, wash your hair first as usual with your   normal  shampoo.  3.  After you shampoo, rinse your hair and body thoroughly to remove the  shampoo.                           4.  Use CHG as you would any other liquid soap.  You can apply chg directly  to the skin and wash                       Gently with a scrungie or clean washcloth.  5.  Apply the CHG Soap to your body ONLY FROM THE NECK DOWN.   Do not use on face/ open                           Wound or open sores. Avoid contact with eyes, ears mouth and genitals (private parts).                       Wash face,  Genitals (private parts) with your normal soap.             6.  Wash thoroughly, paying special attention  to the area where your surgery  will be performed.  7.  Thoroughly rinse your body with warm water from the neck down.  8.  DO NOT shower/wash with your normal soap after using and rinsing off  the CHG Soap.                9.  Pat yourself dry with a clean towel.            10.  Wear clean pajamas.            11.  Place clean sheets on your bed the night of your first shower and do not  sleep with pets. Day of Surgery : Do not apply any lotions/deodorants the morning of surgery.  Please wear clean clothes to the hospital/surgery center.  FAILURE TO FOLLOW THESE INSTRUCTIONS MAY RESULT IN THE CANCELLATION OF YOUR SURGERY PATIENT SIGNATURE_________________________________  NURSE SIGNATURE__________________________________  ________________________________________________________________________

## 2017-03-14 NOTE — Progress Notes (Addendum)
NOTED PAT DEPARTMENT HAVE BEEN ABLE TO CONTACT PT FOR APPT.  LM W/ PT # GIVEN BY OFFICE [phone], STATING IS IMPORTMENT TO CALL BACK FOR PAT APPT W/ LAB WORK PRIOR TO SURGERY, LEFT [facility] PRE-OP # AND PAT # AND IF PT NOT HAVING SURGERY PLEASE CALL HEATHER AT DR [facility] ALSO, CALLED AND LM FOR HEATHER VIA PHONE ABOUT SITUATION.

## 2017-03-14 NOTE — Progress Notes (Signed)
EKG 06-18-16 epic  CXR 12-05-16 epic

## 2017-03-15 ENCOUNTER — Encounter (HOSPITAL_COMMUNITY)
Admission: RE | Admit: 2017-03-15 | Discharge: 2017-03-15 | Disposition: A | Discharge status: Home / Self Care | Payer: Medicare Other | Source: Ambulatory Visit | Attending: Obstetrics and Gynecology | Admitting: Obstetrics and Gynecology

## 2017-03-15 ENCOUNTER — Encounter (HOSPITAL_COMMUNITY): Payer: Self-pay | Admitting: *Deleted

## 2017-03-15 DIAGNOSIS — K589 Irritable bowel syndrome without diarrhea: Secondary | ICD-10-CM | POA: Diagnosis not present

## 2017-03-15 DIAGNOSIS — Z01812 Encounter for preprocedural laboratory examination: Secondary | ICD-10-CM | POA: Insufficient documentation

## 2017-03-15 DIAGNOSIS — N83202 Unspecified ovarian cyst, left side: Secondary | ICD-10-CM | POA: Diagnosis not present

## 2017-03-15 DIAGNOSIS — Z0183 Encounter for blood typing: Secondary | ICD-10-CM

## 2017-03-15 DIAGNOSIS — N800 Endometriosis of uterus: Secondary | ICD-10-CM

## 2017-03-15 DIAGNOSIS — K219 Gastro-esophageal reflux disease without esophagitis: Secondary | ICD-10-CM | POA: Diagnosis not present

## 2017-03-15 DIAGNOSIS — D251 Intramural leiomyoma of uterus: Secondary | ICD-10-CM | POA: Diagnosis not present

## 2017-03-15 DIAGNOSIS — N83291 Other ovarian cyst, right side: Secondary | ICD-10-CM | POA: Diagnosis present

## 2017-03-15 DIAGNOSIS — N939 Abnormal uterine and vaginal bleeding, unspecified: Secondary | ICD-10-CM | POA: Diagnosis not present

## 2017-03-15 HISTORY — DX: Unspecified osteoarthritis, unspecified site: M19.90

## 2017-03-15 LAB — ABO/RH: ABO/RH(D): B POS

## 2017-03-15 LAB — HEPATIC FUNCTION PANEL
ALBUMIN: 4.4 g/dL (ref 3.5–5.0)
ALT: 10 U/L — ABNORMAL LOW (ref 14–54)
AST: 20 U/L (ref 15–41)
Alkaline Phosphatase: 72 U/L (ref 38–126)
BILIRUBIN DIRECT: 0.1 mg/dL (ref 0.1–0.5)
Indirect Bilirubin: 0.5 mg/dL (ref 0.3–0.9)
Total Bilirubin: 0.6 mg/dL (ref 0.3–1.2)
Total Protein: 8.3 g/dL — ABNORMAL HIGH (ref 6.5–8.1)

## 2017-03-15 LAB — CBC
HEMATOCRIT: 37.9 % (ref 36.0–46.0)
HEMOGLOBIN: 12.3 g/dL (ref 12.0–15.0)
MCH: 28.5 pg (ref 26.0–34.0)
MCHC: 32.5 g/dL (ref 30.0–36.0)
MCV: 87.9 fL (ref 78.0–100.0)
PLATELETS: 281 10*3/uL (ref 150–400)
RBC: 4.31 MIL/uL (ref 3.87–5.11)
RDW: 12.4 % (ref 11.5–15.5)
WBC: 4.3 10*3/uL (ref 4.0–10.5)

## 2017-03-15 LAB — BASIC METABOLIC PANEL
Anion gap: 6 (ref 5–15)
BUN: 9 mg/dL (ref 6–20)
CHLORIDE: 105 mmol/L (ref 101–111)
CO2: 28 mmol/L (ref 22–32)
Calcium: 9.8 mg/dL (ref 8.9–10.3)
Creatinine, Ser: 0.65 mg/dL (ref 0.44–1.00)
GFR calc non Af Amer: >60 mL/min (ref >60)
Glucose, Bld: 108 mg/dL — ABNORMAL HIGH (ref 65–99)
POTASSIUM: 4.8 mmol/L (ref 3.5–5.1)
SODIUM: 139 mmol/L (ref 135–145)

## 2017-03-15 LAB — HCG, SERUM, QUALITATIVE: Preg, Serum: NEGATIVE

## 2017-03-15 NOTE — Progress Notes (Signed)
RN called admitting. Patient not there for check in. RN called patient number listed in epic, no answer. LVMM to call PST back as soon as possible

## 2017-03-16 NOTE — Progress Notes (Signed)
Noted pt was told to arrive at 0600 from PAT at [facility].  Called and lm for pt via phone, told pt to arrive at 0530 instead of 0600 due to extra pre-op time needed.  Requested pt to call back to confirm she received message.

## 2017-03-17 ENCOUNTER — Ambulatory Visit (HOSPITAL_BASED_OUTPATIENT_CLINIC_OR_DEPARTMENT_OTHER): Payer: Medicare Other | Admitting: Anesthesiology

## 2017-03-17 ENCOUNTER — Encounter (HOSPITAL_BASED_OUTPATIENT_CLINIC_OR_DEPARTMENT_OTHER): Payer: Self-pay

## 2017-03-17 ENCOUNTER — Observation Stay (HOSPITAL_BASED_OUTPATIENT_CLINIC_OR_DEPARTMENT_OTHER)
Admission: RE | Admit: 2017-03-17 | Discharge: 2017-03-18 | Disposition: A | Discharge status: Home / Self Care | Payer: Medicare Other | Source: Ambulatory Visit | Attending: Obstetrics and Gynecology | Admitting: Obstetrics and Gynecology

## 2017-03-17 ENCOUNTER — Encounter (HOSPITAL_BASED_OUTPATIENT_CLINIC_OR_DEPARTMENT_OTHER)
Admission: RE | Disposition: A | Discharge status: Home / Self Care | Payer: Self-pay | Source: Ambulatory Visit | Attending: Obstetrics and Gynecology

## 2017-03-17 DIAGNOSIS — N83291 Other ovarian cyst, right side: Secondary | ICD-10-CM | POA: Diagnosis not present

## 2017-03-17 DIAGNOSIS — Z90710 Acquired absence of both cervix and uterus: Secondary | ICD-10-CM | POA: Diagnosis present

## 2017-03-17 DIAGNOSIS — D251 Intramural leiomyoma of uterus: Secondary | ICD-10-CM | POA: Insufficient documentation

## 2017-03-17 DIAGNOSIS — N83202 Unspecified ovarian cyst, left side: Secondary | ICD-10-CM | POA: Insufficient documentation

## 2017-03-17 DIAGNOSIS — N800 Endometriosis of uterus: Secondary | ICD-10-CM | POA: Insufficient documentation

## 2017-03-17 DIAGNOSIS — K589 Irritable bowel syndrome without diarrhea: Secondary | ICD-10-CM | POA: Insufficient documentation

## 2017-03-17 DIAGNOSIS — K219 Gastro-esophageal reflux disease without esophagitis: Secondary | ICD-10-CM | POA: Insufficient documentation

## 2017-03-17 DIAGNOSIS — N939 Abnormal uterine and vaginal bleeding, unspecified: Secondary | ICD-10-CM | POA: Insufficient documentation

## 2017-03-17 HISTORY — PX: LAPAROSCOPIC VAGINAL HYSTERECTOMY WITH SALPINGO OOPHORECTOMY: SHX6681

## 2017-03-17 HISTORY — PX: CYSTOSCOPY: SHX5120

## 2017-03-17 LAB — TYPE AND SCREEN
ABO/RH(D): B POS
Antibody Screen: NEGATIVE

## 2017-03-17 SURGERY — HYSTERECTOMY, VAGINAL, LAPAROSCOPY-ASSISTED, WITH SALPINGO-OOPHORECTOMY
Anesthesia: General | Site: Bladder | Laterality: Bilateral

## 2017-03-17 MED ORDER — SUGAMMADEX SODIUM 200 MG/2ML IV SOLN
INTRAVENOUS | Status: DC | PRN
  Administered 2017-03-17: 150 mg via INTRAVENOUS

## 2017-03-17 MED ORDER — HYDROMORPHONE 1 MG/ML IV SOLN
INTRAVENOUS | Status: DC
  Filled 2017-03-17: qty 25

## 2017-03-17 MED ORDER — DIPHENHYDRAMINE HCL 50 MG/ML IJ SOLN
12.5000 mg | Freq: Four times a day (QID) | INTRAMUSCULAR | Status: DC | PRN
  Filled 2017-03-17: qty 0.25

## 2017-03-17 MED ORDER — MIDAZOLAM HCL 5 MG/5ML IJ SOLN
INTRAMUSCULAR | Status: DC | PRN
  Administered 2017-03-17: 2 mg via INTRAVENOUS

## 2017-03-17 MED ORDER — LACTATED RINGERS IR SOLN
Status: DC | PRN
  Administered 2017-03-17: 3000 mL

## 2017-03-17 MED ORDER — DEXAMETHASONE SODIUM PHOSPHATE 10 MG/ML IJ SOLN
INTRAMUSCULAR | Status: AC
  Filled 2017-03-17: qty 1

## 2017-03-17 MED ORDER — ACETAMINOPHEN 325 MG PO TABS
650.0000 mg | ORAL_TABLET | ORAL | Status: DC | PRN
  Filled 2017-03-17: qty 2

## 2017-03-17 MED ORDER — OXYCODONE-ACETAMINOPHEN 5-325 MG PO TABS
ORAL_TABLET | ORAL | Status: AC
  Filled 2017-03-17: qty 1

## 2017-03-17 MED ORDER — NALOXONE HCL 0.4 MG/ML IJ SOLN
0.4000 mg | INTRAMUSCULAR | Status: DC | PRN
  Filled 2017-03-17: qty 1

## 2017-03-17 MED ORDER — FENTANYL CITRATE (PF) 250 MCG/5ML IJ SOLN
INTRAMUSCULAR | Status: AC
  Filled 2017-03-17: qty 5

## 2017-03-17 MED ORDER — LIDOCAINE 2% (20 MG/ML) 5 ML SYRINGE
INTRAMUSCULAR | Status: DC | PRN
  Administered 2017-03-17: 80 mg via INTRAVENOUS

## 2017-03-17 MED ORDER — MIDAZOLAM HCL 2 MG/2ML IJ SOLN
INTRAMUSCULAR | Status: AC
  Filled 2017-03-17: qty 2

## 2017-03-17 MED ORDER — WHITE PETROLATUM GEL
Status: AC
  Filled 2017-03-17: qty 5

## 2017-03-17 MED ORDER — MENTHOL 3 MG MT LOZG
1.0000 | LOZENGE | OROMUCOSAL | Status: DC | PRN
  Filled 2017-03-17: qty 9

## 2017-03-17 MED ORDER — KETOROLAC TROMETHAMINE 30 MG/ML IJ SOLN
INTRAMUSCULAR | Status: AC
  Filled 2017-03-17: qty 2

## 2017-03-17 MED ORDER — HYDROMORPHONE HCL-NACL 0.5-0.9 MG/ML-% IV SOSY
PREFILLED_SYRINGE | INTRAVENOUS | Status: AC
  Filled 2017-03-17: qty 1

## 2017-03-17 MED ORDER — PROPOFOL 10 MG/ML IV BOLUS
INTRAVENOUS | Status: AC
  Filled 2017-03-17: qty 20

## 2017-03-17 MED ORDER — LIDOCAINE 2% (20 MG/ML) 5 ML SYRINGE
INTRAMUSCULAR | Status: AC
  Filled 2017-03-17: qty 5

## 2017-03-17 MED ORDER — FENTANYL CITRATE (PF) 100 MCG/2ML IJ SOLN
INTRAMUSCULAR | Status: DC | PRN
  Administered 2017-03-17 (×2): 100 ug via INTRAVENOUS
  Administered 2017-03-17: 50 ug via INTRAVENOUS
  Administered 2017-03-17: 100 ug via INTRAVENOUS

## 2017-03-17 MED ORDER — ONDANSETRON HCL 4 MG/2ML IJ SOLN
4.0000 mg | Freq: Four times a day (QID) | INTRAMUSCULAR | Status: DC | PRN
  Administered 2017-03-17: 4 mg via INTRAVENOUS
  Filled 2017-03-17: qty 2

## 2017-03-17 MED ORDER — LACTATED RINGERS IV SOLN
INTRAVENOUS | Status: DC
  Administered 2017-03-17: 06:00:00 via INTRAVENOUS
  Filled 2017-03-17: qty 1000

## 2017-03-17 MED ORDER — BUPIVACAINE HCL (PF) 0.25 % IJ SOLN
INTRAMUSCULAR | Status: DC | PRN
  Administered 2017-03-17: 5 mL

## 2017-03-17 MED ORDER — HYDROMORPHONE HCL 1 MG/ML IJ SOLN
0.2500 mg | INTRAMUSCULAR | Status: DC | PRN
  Administered 2017-03-17 (×2): 0.5 mg via INTRAVENOUS
  Administered 2017-03-17: 0.25 mg via INTRAVENOUS
  Administered 2017-03-17: 0.5 mg via INTRAVENOUS
  Administered 2017-03-17: 0.25 mg via INTRAVENOUS
  Administered 2017-03-18: 0.5 mg via INTRAVENOUS
  Filled 2017-03-17: qty 0.5

## 2017-03-17 MED ORDER — KETOROLAC TROMETHAMINE 30 MG/ML IJ SOLN
INTRAMUSCULAR | Status: DC | PRN
  Administered 2017-03-17: 30 mg via INTRAVENOUS

## 2017-03-17 MED ORDER — OXYCODONE-ACETAMINOPHEN 5-325 MG PO TABS
1.0000 | ORAL_TABLET | ORAL | Status: DC | PRN
  Administered 2017-03-17 (×4): 1 via ORAL
  Administered 2017-03-18: 2 via ORAL
  Administered 2017-03-18: 1 via ORAL
  Filled 2017-03-17: qty 2

## 2017-03-17 MED ORDER — HYDROMORPHONE HCL-NACL 0.5-0.9 MG/ML-% IV SOSY
PREFILLED_SYRINGE | INTRAVENOUS | Status: AC
Start: 2017-03-17 — End: 2017-03-17
  Filled 2017-03-17: qty 1

## 2017-03-17 MED ORDER — SUGAMMADEX SODIUM 200 MG/2ML IV SOLN
INTRAVENOUS | Status: AC
  Filled 2017-03-17: qty 2

## 2017-03-17 MED ORDER — CEFAZOLIN SODIUM-DEXTROSE 2-4 GM/100ML-% IV SOLN
2.0000 g | INTRAVENOUS | Status: AC
  Administered 2017-03-17: 2 g via INTRAVENOUS
  Filled 2017-03-17: qty 100

## 2017-03-17 MED ORDER — ROCURONIUM BROMIDE 50 MG/5ML IV SOSY
PREFILLED_SYRINGE | INTRAVENOUS | Status: AC
  Filled 2017-03-17: qty 5

## 2017-03-17 MED ORDER — CEFAZOLIN SODIUM-DEXTROSE 2-4 GM/100ML-% IV SOLN
INTRAVENOUS | Status: AC
  Filled 2017-03-17: qty 100

## 2017-03-17 MED ORDER — ONDANSETRON HCL 4 MG/2ML IJ SOLN
4.0000 mg | Freq: Four times a day (QID) | INTRAMUSCULAR | Status: DC | PRN
  Filled 2017-03-17: qty 2

## 2017-03-17 MED ORDER — LACTATED RINGERS IV SOLN
INTRAVENOUS | Status: DC
  Administered 2017-03-17: 14:00:00 via INTRAVENOUS
  Filled 2017-03-17 (×2): qty 1000

## 2017-03-17 MED ORDER — ONDANSETRON HCL 4 MG/2ML IJ SOLN
INTRAMUSCULAR | Status: AC
  Filled 2017-03-17: qty 2

## 2017-03-17 MED ORDER — FENTANYL CITRATE (PF) 100 MCG/2ML IJ SOLN
INTRAMUSCULAR | Status: AC
  Filled 2017-03-17: qty 2

## 2017-03-17 MED ORDER — PROPOFOL 10 MG/ML IV BOLUS
INTRAVENOUS | Status: DC | PRN
  Administered 2017-03-17: 150 mg via INTRAVENOUS

## 2017-03-17 MED ORDER — ROCURONIUM BROMIDE 50 MG/5ML IV SOSY
PREFILLED_SYRINGE | INTRAVENOUS | Status: DC | PRN
  Administered 2017-03-17: 50 mg via INTRAVENOUS

## 2017-03-17 MED ORDER — PROMETHAZINE HCL 25 MG/ML IJ SOLN
INTRAMUSCULAR | Status: AC
  Filled 2017-03-17: qty 1

## 2017-03-17 MED ORDER — DIPHENHYDRAMINE HCL 12.5 MG/5ML PO ELIX
12.5000 mg | ORAL_SOLUTION | Freq: Four times a day (QID) | ORAL | Status: DC | PRN
  Filled 2017-03-17: qty 5

## 2017-03-17 MED ORDER — SODIUM CHLORIDE 0.9% FLUSH
9.0000 mL | INTRAVENOUS | Status: DC | PRN
  Filled 2017-03-17: qty 10

## 2017-03-17 MED ORDER — ONDANSETRON HCL 4 MG PO TABS
4.0000 mg | ORAL_TABLET | Freq: Four times a day (QID) | ORAL | Status: DC | PRN
  Filled 2017-03-17: qty 1

## 2017-03-17 MED ORDER — PROMETHAZINE HCL 25 MG/ML IJ SOLN
6.2500 mg | INTRAMUSCULAR | Status: DC | PRN
  Administered 2017-03-17: 6.25 mg via INTRAVENOUS
  Filled 2017-03-17: qty 1

## 2017-03-17 MED ORDER — KETOROLAC TROMETHAMINE 30 MG/ML IJ SOLN
INTRAMUSCULAR | Status: AC
  Filled 2017-03-17: qty 1

## 2017-03-17 MED ORDER — LACTATED RINGERS IV SOLN
INTRAVENOUS | Status: DC
  Administered 2017-03-17: 09:00:00 via INTRAVENOUS
  Filled 2017-03-17 (×2): qty 1000

## 2017-03-17 MED ORDER — ONDANSETRON HCL 4 MG/2ML IJ SOLN
INTRAMUSCULAR | Status: DC | PRN
  Administered 2017-03-17: 4 mg via INTRAVENOUS

## 2017-03-17 MED ORDER — DEXAMETHASONE SODIUM PHOSPHATE 10 MG/ML IJ SOLN
INTRAMUSCULAR | Status: DC | PRN
  Administered 2017-03-17: 10 mg via INTRAVENOUS

## 2017-03-17 SURGICAL SUPPLY — 68 items
ADH SKN CLS APL DERMABOND .7 (GAUZE/BANDAGES/DRESSINGS) ×2
BAG SPEC RTRVL LRG 6X4 10 (ENDOMECHANICALS)
BLADE CLIPPER SURG (BLADE) IMPLANT
CANISTER SUCT 3000ML PPV (MISCELLANEOUS) ×8 IMPLANT
CATH ROBINSON RED A/P 16FR (CATHETERS) ×6 IMPLANT
CLOSURE WOUND 1/4X4 (GAUZE/BANDAGES/DRESSINGS)
CLOTH BEACON ORANGE TIMEOUT ST (SAFETY) ×4 IMPLANT
COVER BACK TABLE 60X90IN (DRAPES) ×4 IMPLANT
COVER MAYO STAND STRL (DRAPES) ×8 IMPLANT
DERMABOND ADVANCED (GAUZE/BANDAGES/DRESSINGS) ×2
DERMABOND ADVANCED .7 DNX12 (GAUZE/BANDAGES/DRESSINGS) ×2 IMPLANT
DRSG OPSITE POSTOP 3X4 (GAUZE/BANDAGES/DRESSINGS) ×6 IMPLANT
DRSG TEGADERM 4X4.75 (GAUZE/BANDAGES/DRESSINGS) ×2 IMPLANT
ELECT REM PT RETURN 9FT ADLT (ELECTROSURGICAL) ×4
ELECTRODE REM PT RTRN 9FT ADLT (ELECTROSURGICAL) ×2 IMPLANT
FILTER SMOKE EVAC LAPAROSHD (FILTER) IMPLANT
GAUZE SPONGE 4X4 12PLY STRL (GAUZE/BANDAGES/DRESSINGS) ×2 IMPLANT
GAUZE SPONGE 4X4 12PLY STRL LF (GAUZE/BANDAGES/DRESSINGS) ×2 IMPLANT
GLOVE BIO SURGEON STRL SZ7 (GLOVE) ×16 IMPLANT
GLOVE BIOGEL PI IND STRL 6.5 (GLOVE) ×2 IMPLANT
GLOVE BIOGEL PI INDICATOR 6.5 (GLOVE) ×2
HOLDER FOLEY CATH W/STRAP (MISCELLANEOUS) ×4 IMPLANT
KIT RM TURNOVER CYSTO AR (KITS) ×4 IMPLANT
LEGGING LITHOTOMY PAIR STRL (DRAPES) ×4 IMPLANT
NDL INSUFFLATION 14GA 120MM (NEEDLE) IMPLANT
NDL INSUFFLATION 14GA 150MM (NEEDLE) IMPLANT
NEEDLE INSUFFLATION 14GA 120MM (NEEDLE) IMPLANT
NEEDLE INSUFFLATION 14GA 150MM (NEEDLE) IMPLANT
NS IRRIG 500ML POUR BTL (IV SOLUTION) ×4 IMPLANT
PACK LAVH (CUSTOM PROCEDURE TRAY) ×4 IMPLANT
PACK ROBOTIC GOWN (GOWN DISPOSABLE) ×4 IMPLANT
PACK TRENDGUARD 450 HYBRID PRO (MISCELLANEOUS) IMPLANT
PACK TRENDGUARD 600 HYBRD PROC (MISCELLANEOUS) IMPLANT
PAD OB MATERNITY 4.3X12.25 (PERSONAL CARE ITEMS) ×4 IMPLANT
PAD PREP 24X48 CUFFED NSTRL (MISCELLANEOUS) ×4 IMPLANT
POUCH SPECIMEN RETRIEVAL 10MM (ENDOMECHANICALS) IMPLANT
SCISSORS LAP 5X35 DISP (ENDOMECHANICALS) IMPLANT
SCISSORS LAP 5X45 EPIX DISP (ENDOMECHANICALS) IMPLANT
SEALER TISSUE G2 CVD JAW 45CM (ENDOMECHANICALS) ×4 IMPLANT
SET IRRIG TUBING LAPAROSCOPIC (IRRIGATION / IRRIGATOR) ×2 IMPLANT
SET IRRIG Y TYPE TUR BLADDER L (SET/KITS/TRAYS/PACK) ×2 IMPLANT
SOLUTION ELECTROLUBE (MISCELLANEOUS) IMPLANT
STRIP CLOSURE SKIN 1/4X4 (GAUZE/BANDAGES/DRESSINGS) IMPLANT
SUT MNCRL AB 4-0 PS2 18 (SUTURE) ×4 IMPLANT
SUT MON AB 2-0 CT1 36 (SUTURE) ×4 IMPLANT
SUT VIC AB 0 CT1 18XCR BRD8 (SUTURE) ×4 IMPLANT
SUT VIC AB 0 CT1 36 (SUTURE) ×8 IMPLANT
SUT VIC AB 0 CT1 8-18 (SUTURE) ×12
SUT VIC AB 2-0 CT1 (SUTURE) IMPLANT
SUT VIC AB 2-0 SH 27 (SUTURE) ×4
SUT VIC AB 2-0 SH 27XBRD (SUTURE) IMPLANT
SUT VIC AB 3-0 SH 27 (SUTURE)
SUT VIC AB 3-0 SH 27X BRD (SUTURE) IMPLANT
SUT VICRYL 0 UR6 27IN ABS (SUTURE) ×2 IMPLANT
SUT VICRYL 1 TIES 12X18 (SUTURE) ×4 IMPLANT
SUT VICRYL 4-0 PS2 18IN ABS (SUTURE) ×2 IMPLANT
SYR BULB IRRIGATION 50ML (SYRINGE) IMPLANT
TOWEL OR 17X24 6PK STRL BLUE (TOWEL DISPOSABLE) ×8 IMPLANT
TRAY FOLEY CATH SILVER 14FR (SET/KITS/TRAYS/PACK) ×4 IMPLANT
TRENDGUARD 450 HYBRID PRO PACK (MISCELLANEOUS) ×4
TRENDGUARD 600 HYBRID PROC PK (MISCELLANEOUS)
TROCAR BALLN 12MMX100 BLUNT (TROCAR) ×2 IMPLANT
TROCAR OPTI TIP 5M 100M (ENDOMECHANICALS) ×4 IMPLANT
TROCAR XCEL 12X100 BLDLESS (ENDOMECHANICALS) IMPLANT
TROCAR XCEL DIL TIP R 11M (ENDOMECHANICALS) IMPLANT
TUBING INSUF HEATED (TUBING) ×4 IMPLANT
WARMER LAPAROSCOPE (MISCELLANEOUS) ×4 IMPLANT
WATER STERILE IRR 500ML POUR (IV SOLUTION) ×4 IMPLANT

## 2017-03-17 NOTE — Progress Notes (Signed)
Assisted pt oob.  Ambulation in hall w minimal assistance.  Pt walked to nursing station in PACU and back.  Pt tolerated walk well.

## 2017-03-17 NOTE — Progress Notes (Signed)
Patient ID: [name], female   DOB: Oct 10, 1965, 51 y.o.   MRN: [id number] AF VSS GOOD UO ABD SOFT MINIMAL BEEDING

## 2017-03-17 NOTE — Brief Op Note (Signed)
Patient name  [name], [name] DICTATION# [id number] CSN# [id number]  [facility], MD 03/17/2017 8:55 AM

## 2017-03-17 NOTE — OR Nursing (Signed)
OOB to ambulate in hallway. Lightheaded when first getting up. Tolerated well

## 2017-03-17 NOTE — Anesthesia Postprocedure Evaluation (Signed)
Anesthesia Post Note  Patient: [name]  Procedure(s) Performed: Procedure(s) (LRB): LAPAROSCOPIC ASSISTED VAGINAL HYSTERECTOMY WITH SALPINGO OOPHORECTOMY (Bilateral) CYSTOSCOPY     Patient location during evaluation: PACU Anesthesia Type: General Level of consciousness: awake, sedated and oriented Pain management: pain level controlled Vital Signs Assessment: post-procedure vital signs reviewed and stable Respiratory status: spontaneous breathing, nonlabored ventilation, respiratory function stable and patient connected to nasal cannula oxygen Cardiovascular status: blood pressure returned to baseline and stable Postop Assessment: no signs of nausea or vomiting Anesthetic complications: no    Last Vitals:  Vitals:   03/17/17 1100 03/17/17 1145  BP: 118/67 114/62  Pulse: 68 84  Resp: 14 14  Temp: (!) 36.3 C   SpO2: 97% 96%    Last Pain:  Vitals:   03/17/17 1145  TempSrc:   PainSc: 7                  Ritu Gagliardo,JAMES TERRILL

## 2017-03-17 NOTE — H&P (Signed)
  History and physical exam unchanged 

## 2017-03-17 NOTE — Transfer of Care (Signed)
Immediate Anesthesia Transfer of Care Note  Patient: [name]  Procedure(s) Performed: Procedure(s): LAPAROSCOPIC ASSISTED VAGINAL HYSTERECTOMY WITH SALPINGO OOPHORECTOMY (Bilateral)  Patient Location: PACU  Anesthesia Type:General  Level of Consciousness: awake and oriented  Airway & Oxygen Therapy: Patient Spontanous Breathing and Patient connected to nasal cannula oxygen  Post-op Assessment: Report given to RN  Post vital signs: Reviewed and stable  Last Vitals:  Vitals:   03/17/17 0550 03/17/17 0904  BP: 109/63 (!) (P) 113/55  Pulse: 75   Resp: 16 (P) 12  Temp: 36.9 C (P) 36.4 C  SpO2: 100%     Last Pain:  Vitals:   03/17/17 0550  TempSrc: Oral         Complications: No apparent anesthesia complications

## 2017-03-17 NOTE — Anesthesia Preprocedure Evaluation (Signed)
Anesthesia Evaluation  Patient identified by MRN, date of birth, ID band Patient awake    Reviewed: Allergy & Precautions, NPO status , Patient's Chart, lab work & pertinent test results  Airway Mallampati: II   Neck ROM: Full    Dental no notable dental hx.    Pulmonary neg pulmonary ROS,    breath sounds clear to auscultation       Cardiovascular negative cardio ROS   Rhythm:Regular Rate:Normal     Neuro/Psych Seizures -,  negative psych ROS   GI/Hepatic negative GI ROS, Neg liver ROS, GERD  ,  Endo/Other  negative endocrine ROS  Renal/GU negative Renal ROS  negative genitourinary   Musculoskeletal negative musculoskeletal ROS (+)   Abdominal   Peds negative pediatric ROS (+)  Hematology negative hematology ROS (+)   Anesthesia Other Findings   Reproductive/Obstetrics negative OB ROS                             Anesthesia Physical Anesthesia Plan  ASA: II  Anesthesia Plan: General   Post-op Pain Management:    Induction: Intravenous  PONV Risk Score and Plan: 4 or greater and Ondansetron, Dexamethasone, Midazolam, Scopolamine patch - Pre-op, Propofol infusion and Treatment may vary due to age or medical condition  Airway Management Planned: Oral ETT  Additional Equipment:   Intra-op Plan:   Post-operative Plan: Extubation in OR  Informed Consent:   Plan Discussed with:   Anesthesia Plan Comments:         Anesthesia Quick Evaluation

## 2017-03-17 NOTE — Progress Notes (Signed)
aswsisted oob.  Ambulated entire length of unit w minimal assistance.  Pt tolerated very well. Pt smiling, laughing, appears in good spirits.

## 2017-03-17 NOTE — Anesthesia Procedure Notes (Signed)
Procedure Name: Intubation Date/Time: 03/17/2017 7:27 AM Performed by: Bethena Roys T Pre-anesthesia Checklist: Patient identified, Emergency Drugs available, Suction available and Patient being monitored Patient Re-evaluated:Patient Re-evaluated prior to induction Oxygen Delivery Method: Circle system utilized Preoxygenation: Pre-oxygenation with 100% oxygen Induction Type: IV induction Ventilation: Mask ventilation without difficulty Laryngoscope Size: Mac and 3 Grade View: Grade I Tube type: Oral Tube size: 7.0 mm Number of attempts: 1 Airway Equipment and Method: Stylet Placement Confirmation: ETT inserted through vocal cords under direct vision,  positive ETCO2 and breath sounds checked- equal and bilateral Secured at: 21 cm Tube secured with: Tape Dental Injury: Teeth and Oropharynx as per pre-operative assessment

## 2017-03-17 NOTE — Op Note (Signed)
[name], [name]:  [id number]  MEDICAL RECORD NO.:  [id number]  LOCATION:                                 FACILITY:  PHYSICIAN:  [name], M.D.   DATE OF BIRTH:  01-Apr-1966  DATE OF PROCEDURE:  03/17/2017 DATE OF DISCHARGE:                              OPERATIVE REPORT   PREOPERATIVE DIAGNOSIS:  Complex cyst of the right ovary, pelvic pain and pressure.  Positive for Lynch syndrome.  POSTOPERATIVE DIAGNOSIS:  Complex cyst of the right ovary, pelvic pain and pressure.  Positive for Lynch syndrome.  PROCEDURES:  Laparoscopic-assisted vaginal hysterectomy, bilateral salpingo-oophorectomy, and cystoscopy.  SURGEON:  [name], M.D.  ASSISTANT:  Daleen Bo. Gaetano Net, M.D.  ANESTHESIA:  General endotracheal.  ESTIMATED BLOOD LOSS:  Approximately 300 mL.  PACKS:  None.  DRAINS:  Urethral Foley.  INTRAOPERATIVE BLOOD PLACED:  None.  COMPLICATIONS:  None.  INDICATIONS:  Dictated in history and physical.  PROCEDURE IN DETAIL:  The patient was taken to the OR, placed in supine position.  After satisfactory level of general endotracheal anesthesia was obtained, the patient was placed in dorsal supine position using the Allen stirrups.  At this point in time, the perineum and vagina were prepped out with Betadine.  Bladder was emptied by in-and-out catheterization.  The abdomen was prepped with DuraPrep.  The patient was then draped sterile field.  She had a wide midline incision.  The umbilicus was pulled slightly that way.  We infiltrated the subumbilical area with Marcaine.  We made a subumbilical incision that went a little bit to the left.  We dissected down to the fascia.  The fascia was entered sharply.  Peritoneum was entered with blunt finger pressure. Open laparoscopic trocar was put in place and secured.  Abdomen was inflated with carbon dioxide.  Laparoscope was introduced.  A 5-mm trocar was put in place in suprapubic area under  direct visualization. Visualization revealed the abdomen to be normal.  Appendix was clear. Both lateral gutters were clear.  Upper abdomen including tip of the gallbladder were clear.  At this point in time, the uterus was elevated. Uterus was unremarkable.  Right ovary had a cystic enlargement about 2 cm.  Left ovary was unremarkable.  Cul-de-sac was completely clear.  Next, using the EnSeal, we elevated the right ovary, identified the ureter.  The ovarian vasculature was cauterized and incised.  Mesenteric attachments of the tube and ovary were cauterized and incised up to the uterus.  Right round ligament was cauterized and incised.  The cyst was entered.  At this point in time, it turned out to be an endometrioma. We then went to the left side.  Left ovary was elevated.  Ureter was easily identified.  Left ovarian vasculature was cauterized and incised. Mesenteric attachments of the tube and ovary were cauterized and incised up to the uterus.  Left round ligament was cauterized and incised.  We had fairly good hemostasis.  The decision was to go vaginally. Laparoscope was then removed.  Abdomen was deflated with carbon dioxide.  The patient's legs were repositioned.  The Hulka tenaculum was then removed.  A weighted  speculum was placed in vaginal vault.  The cervix was grasped with a Ardis Hughs tenaculum.  The cul-de-sac was entered sharply.  Both uterosacral ligaments were clamped, cut, and suture ligated with 0 Vicryl.  The reflection of the mucosa anteriorly was incised.  Bladder was dissected superiorly.  Paracervical tissue was then clamped, cut, and suture ligated with 0 Vicryl.  Vesicouterine space was identified, entered sharply, and retractor was put in place using a clamp cut and tie technique with suture ligature of 0 Vicryl. Parametrium was serially separated from the sides of uterus.  Uterus was then flipped.  Remaining pedicles were clamped, cut, uterus and tubes and  ovaries were passed off the operative field.  Held pedicles were secured with free tie of 0 Vicryl.  Posterior cuff was then run with a running locking suture of 0 Vicryl.  Areas of bleeding were brought under control with figure-of-eights of 0 Vicryl.  Vaginal mucosa was then reapproximated with interrupted figure-of-eights of 0 Vicryl.  We had good hemostasis at this point.  Bladder was emptied by in-and-out catheterization.  Urine was clear.  At this point time, legs were repositioned.  Abdomen was reinflated with carbon dioxide.  Visualization with the laparoscope revealed some oozing from the left ovarian vascular pedicle.  This was brought under control using the EnSeal.  Other areas of oozing was brought under control using the EnSeal.  We thoroughly irrigated the pelvis.  We had good hemostasis.  We deflated the abdomen and reinflated to see if there was any further bleeding or so, was not.  At this point in time, the abdomen was deflated with carbon dioxide.  All trocars were removed. Subumbilical fascia was closed with 2 figure-of-eights of 0 Vicryl. Skin was closed with interrupted subcuticulars of 4-0 Monocryl. Suprapubic incision was closed with Dermabond.  Legs were again repositioned.  Cystoscope was then inserted.  The bladder was filled with irrigation.  Visualization revealed no evidence of injury to the bladder.  Both ureteral orifices were identified, noted to be have jets of clear urine coming from each one.  Cystoscope was then removed.  Foley was placed straight drain.  The patient was taken out of the dorsal lithotomy position.  Once alert and extubated, transferred to recovery room in good condition.  Sponge, instrument, and needle correct count was reported by the circulating nurse multiple times.     [name], M.D.     JSM/MEDQ  D:  03/17/2017  T:  03/17/2017  Job:  [id number]

## 2017-03-18 DIAGNOSIS — N83291 Other ovarian cyst, right side: Secondary | ICD-10-CM | POA: Diagnosis not present

## 2017-03-18 LAB — CBC
HCT: 34.9 % — ABNORMAL LOW (ref 36.0–46.0)
HEMOGLOBIN: 11.4 g/dL — AB (ref 12.0–15.0)
MCH: 28.4 pg (ref 26.0–34.0)
MCHC: 32.7 g/dL (ref 30.0–36.0)
MCV: 87 fL (ref 78.0–100.0)
Platelets: 253 10*3/uL (ref 150–400)
RBC: 4.01 MIL/uL (ref 3.87–5.11)
RDW: 12.4 % (ref 11.5–15.5)
WBC: 10.3 10*3/uL (ref 4.0–10.5)

## 2017-03-18 MED ORDER — OXYCODONE-ACETAMINOPHEN 5-325 MG PO TABS
ORAL_TABLET | ORAL | Status: AC
  Filled 2017-03-18: qty 2

## 2017-03-18 MED ORDER — OXYCODONE-ACETAMINOPHEN 5-325 MG PO TABS
ORAL_TABLET | ORAL | Status: AC
  Filled 2017-03-18: qty 1

## 2017-03-18 MED ORDER — OXYCODONE-ACETAMINOPHEN 7.5-325 MG PO TABS: 1.0000 | ORAL_TABLET | ORAL | 0 refills | Status: DC | PRN

## 2017-03-18 NOTE — Discharge Summary (Signed)
Patient name [name], [name] DICTATION# [id number] CSN# [id number]  [name], MD 03/18/2017 8:07 AM

## 2017-03-18 NOTE — Progress Notes (Signed)
1 Day Post-Op Procedure(s) (LRB): LAPAROSCOPIC ASSISTED VAGINAL HYSTERECTOMY WITH SALPINGO OOPHORECTOMY (Bilateral) CYSTOSCOPY  Subjective: Patient reports tolerating PO.    Objective: I have reviewed patient's vital signs, intake and output and labs.  General: alert GI: soft, non-tender; bowel sounds normal; no masses,  no organomegaly Vaginal Bleeding: minimal  Assessment: s/p Procedure(s): LAPAROSCOPIC ASSISTED VAGINAL HYSTERECTOMY WITH SALPINGO OOPHORECTOMY (Bilateral) CYSTOSCOPY: stable  Plan: Discharge home  LOS: 0 days    Alliah Boulanger S 03/18/2017, 8:07 AM

## 2017-03-19 NOTE — Discharge Summary (Signed)
[name], [name]:  [id number]  MEDICAL RECORD NO.:  [id number]  LOCATION:                                 FACILITY:  PHYSICIAN:  [name], M.D.   DATE OF BIRTH:  09-Jul-1966  DATE OF ADMISSION:  03/17/2017 DATE OF DISCHARGE:  03/18/2017                              DISCHARGE SUMMARY   ADMITTING DIAGNOSIS:  Abdominal pain, cystic enlargement of the right ovary.  The patient positive for Lynch syndrome.  DISCHARGE DIAGNOSIS:  Abdominal pain, cystic enlargement of the right ovary.  The patient positive for Lynch syndrome.  OPERATIVE PROCEDURES:  Laparoscopic assisted vaginal hysterectomy, bilateral salpingo-oophorectomy, and cystoscopy.  For complete history and physical, please see dictated note.  COURSE IN THE HOSPITAL:  The patient underwent above-noted surgery.  She did have an endometrioma involving the right ovary.  Postop hemoglobin was 11.4.  On the first postop day, she was afebrile.  Vital signs were stable.  Her abdomen was soft, nontender.  All incisions were clear. She was having minimal vaginal spotting.  She was tolerating her diet. Had not voided as of yet.  In terms of complications, none were encountered during her stay in the hospital.  The patient was discharged home in stable condition.  DISPOSITION:  The patient should avoid heavy lifting, driving of a car, or vaginal entrance.  She is to call the office should there be any signs of heavy bleeding.  Fever should be reported.  Nausea, vomiting should be reported.  Excessive pain should be reported.  Also instructed on signs and symptoms of deep venous thrombosis and pulmonary embolus. Office will call her on Tuesday, they will arrange followup.  We will discharge her home on Percocet as needed for pain.     [name], M.D.     JSM/MEDQ  D:  03/18/2017  T:  03/18/2017  Job:  [id number]

## 2017-03-22 ENCOUNTER — Encounter (HOSPITAL_BASED_OUTPATIENT_CLINIC_OR_DEPARTMENT_OTHER): Payer: Self-pay | Admitting: Obstetrics and Gynecology

## 2018-03-23 ENCOUNTER — Ambulatory Visit (HOSPITAL_COMMUNITY)
Admission: EM | Admit: 2018-03-23 | Discharge: 2018-03-23 | Disposition: A | Discharge status: Home / Self Care | Payer: Medicare HMO | Attending: Family Medicine | Admitting: Family Medicine

## 2018-03-23 ENCOUNTER — Encounter (HOSPITAL_COMMUNITY): Payer: Self-pay | Admitting: Emergency Medicine

## 2018-03-23 DIAGNOSIS — M62830 Muscle spasm of back: Secondary | ICD-10-CM | POA: Diagnosis not present

## 2018-03-23 LAB — POCT URINALYSIS DIP (DEVICE)
Bilirubin Urine: NEGATIVE
Glucose, UA: NEGATIVE mg/dL
HGB URINE DIPSTICK: NEGATIVE
Ketones, ur: NEGATIVE mg/dL
LEUKOCYTES UA: NEGATIVE
NITRITE: NEGATIVE
PH: 5.5 (ref 5.0–8.0)
PROTEIN: NEGATIVE mg/dL
Specific Gravity, Urine: <=1.005 (ref 1.005–1.030)
UROBILINOGEN UA: 0.2 mg/dL (ref 0.0–1.0)

## 2018-03-23 MED ORDER — DICLOFENAC SODIUM 75 MG PO TBEC: 75.0000 mg | DELAYED_RELEASE_TABLET | Freq: Two times a day (BID) | ORAL | 0 refills | Status: DC

## 2018-03-23 MED ORDER — CYCLOBENZAPRINE HCL 10 MG PO TABS: 10.0000 mg | ORAL_TABLET | Freq: Every evening | ORAL | 0 refills | Status: DC | PRN

## 2018-03-23 NOTE — ED Triage Notes (Signed)
Pt states she was mopping two weeks ago and thinks she might have tweaked her back, states she cant lay down at night due to the pain.

## 2018-03-23 NOTE — Discharge Instructions (Signed)

## 2018-03-28 NOTE — ED Provider Notes (Signed)
[name]   [id number] 03/23/18 Arrival Time: 3151  ASSESSMENT & PLAN:  1. Muscle spasm of back     Meds ordered this encounter  Medications  . cyclobenzaprine (FLEXERIL) 10 MG tablet    Sig: Take 1 tablet (10 mg total) by mouth at bedtime as needed for muscle spasms.    Dispense:  10 tablet    Refill:  0  . diclofenac (VOLTAREN) 75 MG EC tablet    Sig: Take 1 tablet (75 mg total) by mouth 2 (two) times daily.    Dispense:  14 tablet    Refill:  0   Medication sedation precautions discussed.  ROM/ambulation as tolerated. Encouraged movement.  Follow-up Information    Nolene Ebbs, MD.   Specialty:  Internal Medicine Why:  As needed or if you are not seeing improvement over the next week. Contact information: [address] [place] [address] [phone]          Reviewed expectations re: course of current medical issues. Questions answered. Outlined signs and symptoms indicating need for more acute intervention. Patient verbalized understanding. After Visit Summary given.   SUBJECTIVE: History from: patient.  [name] is a 52 y.o. female who presents with complaint of intermittent bilateral lower back discomfort. Onset gradual beginning several weeks ago. Injury/trama: no, but questions related to mopping more than usual. History of back problems: no. Discomfort described as aching without radiation. Certain movements exacerbate the described discomfort. Better with rest. Extremity sensation changes or weakness: none. Ambulatory without difficulty. Normal bowel/bladder habits. No associated abdominal pain/n/v. Self treatment: has tried nothing for pain relief. Trouble sleeping secondary to discomfort.  Reports no fevers, IV drug use, or recent back surgeries or procedures.  ROS: As per HPI.   OBJECTIVE:  Vitals:   03/23/18 1643  BP: 119/68  Pulse: 69  Resp: 14  Temp: (!) 97.2 F (36.2 C)  SpO2: 100%    General appearance:  alert; no distress Neck: supple with FROM; without midline tenderness Lungs: unlabored respirations; symmetrical air entry Abdomen: soft, non-tender; bowel sounds normal; no masses or organomegaly; no guarding or rebound tenderness Back: bilateral lower tenderness present over paraspinal musculature; FROM at hips with mild discomfort reported; bruising: none; without midline tenderness Extremities: no cyanosis or edema; symmetrical with no gross deformities Skin: warm and dry Neurologic: normal gait; normal symmetric reflexes; normal LE strength and sensation Psychological: alert and cooperative; normal mood and affect  Labs: Results for orders placed or performed during the hospital encounter of 03/23/18  POCT urinalysis dip (device)  Result Value Ref Range   Glucose, UA NEGATIVE NEGATIVE mg/dL   Bilirubin Urine NEGATIVE NEGATIVE   Ketones, ur NEGATIVE NEGATIVE mg/dL   Specific Gravity, Urine <=1.005 1.005 - 1.030   Hgb urine dipstick NEGATIVE NEGATIVE   pH 5.5 5.0 - 8.0   Protein, ur NEGATIVE NEGATIVE mg/dL   Urobilinogen, UA 0.2 0.0 - 1.0 mg/dL   Nitrite NEGATIVE NEGATIVE   Leukocytes, UA NEGATIVE NEGATIVE   Labs Reviewed  POCT URINALYSIS DIP (DEVICE)    No Known Allergies  Past Medical History:  Diagnosis Date  . Arthritis    right leg  . Depression    no meds currently  . GERD (gastroesophageal reflux disease)   . MLH1-related Lynch syndrome (HNPCC2)   . Seizures ([place])    Only one back in 2004, none since, no meds, very stressed, patient just lost her husband  . SVD (spontaneous vaginal delivery)    x 4  Social History   Socioeconomic History  . Marital status: Married    Spouse name: Not on file  . Number of children: Not on file  . Years of education: Not on file  . Highest education level: Not on file  Occupational History  . Not on file  Social Needs  . Financial resource strain: Not on file  . Food insecurity:    Worry: Not on file    Inability:  Not on file  . Transportation needs:    Medical: Not on file    Non-medical: Not on file  Tobacco Use  . Smoking status: Never Smoker  . Smokeless tobacco: Never Used  Substance and Sexual Activity  . Alcohol use: No    Comment: hx of EtOH abuse; no alcohol now  . Drug use: No  . Sexual activity: Not on file  Lifestyle  . Physical activity:    Days per week: Not on file    Minutes per session: Not on file  . Stress: Not on file  Relationships  . Social connections:    Talks on phone: Not on file    Gets together: Not on file    Attends religious service: Not on file    Active member of club or organization: Not on file    Attends meetings of clubs or organizations: Not on file    Relationship status: Not on file  . Intimate partner violence:    Fear of current or ex partner: Not on file    Emotionally abused: Not on file    Physically abused: Not on file    Forced sexual activity: Not on file  Other Topics Concern  . Not on file  Social History Narrative  . Not on file   Family History  Problem Relation Age of Onset  . Other Mother        hx of TAH-BSO in her late 20s-30 for unspecified cause  . Colon cancer Father        dx. early-mid-40s with later "recurrence"  . Ovarian cancer Sister 42  . Colon cancer Brother        dx. 92s; +surgery and chemotherapy  . Colon cancer Paternal Aunt 1  . Colon cancer Paternal Uncle 70  . Cancer Maternal Grandmother        NOS cancer, d. 50s  . Breast cancer Paternal Grandmother        d. early 66s  . Colon polyps Other        "many"; dx. late 31s or younger  . Other Daughter        breast issues--infection and fluid had to be removed; also has issues with on-going periods; dx. 15 or younger  . Other Daughter        cyst on her fallopian tube, being monitored; dx. 46 or younger  . Other Brother        issues with recent illness; bloodwork to check for cancer - age 64  . Other Brother        issues with swollen prostate; GI  issues; age 33  . Cancer Cousin 72       paternal 1st cousin dx. "bowel" cancer  . Colon cancer Cousin 31       paternal 1st cousin  . Colon cancer Cousin 17       paternal 1st cousin  . Colon cancer Cousin 31       paternal 1st cousin  . Colon cancer Cousin  paternal 1st cousin at unspecified age  . Colon cancer Cousin 36       daughter of affected paternal 1st cousin  . Uterine cancer Cousin        paternal 1st cousin, d. 60s  . Schizophrenia Son   . Drug abuse Son   . Cancer Brother        paternal half-brother dx. in his 83s; NOS cancer  . Prostate cancer Brother        paternal half-brother d. 69s   Past Surgical History:  Procedure Laterality Date  . ANKLE SURGERY Right 2001   skin graft and bone replacment from right hip muscle fats from abdomen  . CO2 LASER APPLICATION    . COLONOSCOPY WITH PROPOFOL N/A 02/09/2016   Procedure: COLONOSCOPY WITH PROPOFOL;  Surgeon: Garlan Fair, MD;  Location: [facility];  Service: Endoscopy;  Laterality: N/A;  . CYSTOSCOPY  03/17/2017   Procedure: CYSTOSCOPY;  Surgeon: Arvella Nigh, MD;  Location: [facility];  Service: Gynecology;;  . ESOPHAGOGASTRODUODENOSCOPY (EGD) WITH PROPOFOL N/A 02/09/2016   Procedure: ESOPHAGOGASTRODUODENOSCOPY (EGD) WITH PROPOFOL;  Surgeon: Garlan Fair, MD;  Location: [facility];  Service: Endoscopy;  Laterality: N/A;  . LAPAROSCOPIC VAGINAL HYSTERECTOMY WITH SALPINGO OOPHORECTOMY Bilateral 03/17/2017   Procedure: LAPAROSCOPIC ASSISTED VAGINAL HYSTERECTOMY WITH SALPINGO OOPHORECTOMY;  Surgeon: Arvella Nigh, MD;  Location: [facility];  Service: Gynecology;  Laterality: Bilateral;  . right arm surgery     x 2   . TUBAL LIGATION    . WISDOM TOOTH EXTRACTION       Vanessa Kick, MD 04/03/18 [phone]

## 2018-05-05 ENCOUNTER — Telehealth: Payer: Self-pay | Admitting: Genetic Counselor

## 2018-05-05 ENCOUNTER — Encounter: Payer: Self-pay | Admitting: Genetic Counselor

## 2018-05-05 NOTE — Telephone Encounter (Signed)
I spoke with patient that we have tested other individuals who have the same Lynch mutation as she does and we would like to ask questions about the family to see if we can link them up.  After discussing the family, we are able to link up the family members.    [name] has give [place] permission to provide her father's family members with her genetic test results.  We scheduled her son and daughter in the genetics clinic to undergo genetic testing for Lynch syndrome.  They will be seen on 05/23/2018 with Ferol Luz.

## 2018-06-13 ENCOUNTER — Other Ambulatory Visit: Payer: Self-pay | Admitting: Obstetrics and Gynecology

## 2018-06-13 DIAGNOSIS — N63 Unspecified lump in unspecified breast: Secondary | ICD-10-CM

## 2018-07-10 ENCOUNTER — Other Ambulatory Visit: Payer: Self-pay

## 2018-07-17 ENCOUNTER — Other Ambulatory Visit: Payer: Self-pay

## 2018-07-24 ENCOUNTER — Encounter: Payer: Self-pay | Admitting: Internal Medicine

## 2018-08-15 ENCOUNTER — Other Ambulatory Visit: Payer: Self-pay

## 2018-08-29 ENCOUNTER — Encounter: Payer: Self-pay | Admitting: Internal Medicine

## 2018-08-31 ENCOUNTER — Other Ambulatory Visit: Payer: Self-pay

## 2018-08-31 ENCOUNTER — Ambulatory Visit (AMBULATORY_SURGERY_CENTER): Payer: Self-pay

## 2018-08-31 VITALS — Ht 63.5 in | Wt 186.2 lb

## 2018-08-31 DIAGNOSIS — Z1509 Genetic susceptibility to other malignant neoplasm: Secondary | ICD-10-CM

## 2018-08-31 DIAGNOSIS — Z800 Family history of malignant neoplasm of digestive organs: Secondary | ICD-10-CM

## 2018-08-31 MED ORDER — NA SULFATE-K SULFATE-MG SULF 17.5-3.13-1.6 GM/177ML PO SOLN
1.0000 | Freq: Once | ORAL | 0 refills | Status: AC
Start: 2018-08-31 — End: 2018-08-31

## 2018-08-31 NOTE — Progress Notes (Signed)
No egg or soy allergy known to patient  No issues with past sedation with any surgeries  or procedures, no intubation problems  No diet pills per patient No home 02 use per patient  No blood thinners per patient  Pt denies issues with constipation  No A fib or A flutter  EMMI video sent to pt's e mail , pt declined    

## 2018-09-14 ENCOUNTER — Encounter: Payer: Self-pay | Admitting: Internal Medicine

## 2018-09-15 ENCOUNTER — Telehealth: Payer: Self-pay | Admitting: *Deleted

## 2018-09-15 NOTE — Telephone Encounter (Signed)
  Follow up Call-  No flowsheet data found.   Patient questions:  VM came on. No information on adm regarding a message. No message left.

## 2018-12-01 ENCOUNTER — Ambulatory Visit: Payer: Self-pay | Admitting: Family Medicine

## 2019-01-07 ENCOUNTER — Other Ambulatory Visit: Payer: Self-pay

## 2019-01-07 ENCOUNTER — Encounter (HOSPITAL_COMMUNITY): Payer: Self-pay | Admitting: Emergency Medicine

## 2019-01-07 ENCOUNTER — Emergency Department (HOSPITAL_COMMUNITY)
Admission: EM | Admit: 2019-01-07 | Discharge: 2019-01-07 | Disposition: A | Discharge status: Home / Self Care | Payer: Medicare HMO | Attending: Emergency Medicine | Admitting: Emergency Medicine

## 2019-01-07 DIAGNOSIS — Z20828 Contact with and (suspected) exposure to other viral communicable diseases: Secondary | ICD-10-CM | POA: Diagnosis not present

## 2019-01-07 DIAGNOSIS — R200 Anesthesia of skin: Secondary | ICD-10-CM | POA: Insufficient documentation

## 2019-01-07 DIAGNOSIS — Z79899 Other long term (current) drug therapy: Secondary | ICD-10-CM | POA: Diagnosis not present

## 2019-01-07 DIAGNOSIS — Z20822 Contact with and (suspected) exposure to covid-19: Secondary | ICD-10-CM

## 2019-01-07 NOTE — ED Notes (Signed)
Patient verbalizes understanding of discharge instructions. Opportunity for questioning and answers were provided. Armband removed by staff, pt discharged from ED. Ambulated out to lobby  

## 2019-01-07 NOTE — ED Provider Notes (Signed)
[facility] EMERGENC[facility] DEPARTMENT Provider Note   CSN: [id number] Arrival date & time: 01/07/19  1223     History   Chief Complaint No chief complaint on file.   HPI [name] is a 53 y.o. female.     The history is provided by the patient. No language interpreter was used.     53 year old female presenting for evaluation of potential COVID-19 exposure.  Patient states her husband was notified that his coworker tested positive for COVID-19 several days ago.  Since then he has had some cough congestion.  Patient does not have any viral symptoms however she was concerned for potential exposure as well.  Patient does complain of right hand and wrist numbness which has been ongoing issue for many years but has gotten progressively worse for the past 2 to 3 weeks.  She is right-hand dominant.  She denies any recent injury but did report fall and injured her right arm when she was young.  She denies any specific treatment tried.  She endorsed difficulty turning and twisting objects with her right hand.  She denies diabetes.  No fever chills.  No specific treatment tried.  Past Medical History:  Diagnosis Date  . Arthritis    right leg  . Depression    no meds currently  . GERD (gastroesophageal reflux disease)   . Glaucoma   . MLH1-related Lynch syndrome (HNPCC2)   . Seizures ([place])    Only one back in 2004, none since, no meds, very stressed, patient just lost her husband  . SVD (spontaneous vaginal delivery)    x 4    Patient Active Problem List   Diagnosis Date Noted  . S/P laparoscopic assisted vaginal hysterectomy (LAVH) 03/17/2017  . Alcohol use disorder, moderate, dependence ([place]) 06/18/2016  . MDD (major depressive disorder), single episode, severe with psychotic features ([place]) 06/17/2016  . Genetic testing 03/28/2016  . MLH1-related Lynch syndrome (HNPCC2) 03/28/2016  . Family history of colon cancer 10/16/2015  . Family history of ovarian cancer  10/16/2015  . Family history of breast cancer in female 10/16/2015  . History of colonic polyps 10/16/2015    Past Surgical History:  Procedure Laterality Date  . ANKLE SURGERY Right 2001   skin graft and bone replacment from right hip muscle fats from abdomen  . CO2 LASER APPLICATION    . COLONOSCOPY WITH PROPOFOL N/A 02/09/2016   Procedure: COLONOSCOPY WITH PROPOFOL;  Surgeon: Garlan Fair, MD;  Location: [facility];  Service: Endoscopy;  Laterality: N/A;  . CYSTOSCOPY  03/17/2017   Procedure: CYSTOSCOPY;  Surgeon: Arvella Nigh, MD;  Location: [facility];  Service: Gynecology;;  . ESOPHAGOGASTRODUODENOSCOPY (EGD) WITH PROPOFOL N/A 02/09/2016   Procedure: ESOPHAGOGASTRODUODENOSCOPY (EGD) WITH PROPOFOL;  Surgeon: Garlan Fair, MD;  Location: [facility];  Service: Endoscopy;  Laterality: N/A;  . LAPAROSCOPIC VAGINAL HYSTERECTOMY WITH SALPINGO OOPHORECTOMY Bilateral 03/17/2017   Procedure: LAPAROSCOPIC ASSISTED VAGINAL HYSTERECTOMY WITH SALPINGO OOPHORECTOMY;  Surgeon: Arvella Nigh, MD;  Location: [facility];  Service: Gynecology;  Laterality: Bilateral;  . right arm surgery     x 2   . TUBAL LIGATION    . WISDOM TOOTH EXTRACTION       OB History   No obstetric history on file.      Home Medications    Prior to Admission medications   Medication Sig Start Date End Date Taking? Authorizing Provider  ibuprofen (ADVIL,MOTRIN) 200 MG tablet Take 200 mg by mouth every  6 (six) hours as needed (takes 2 as needed).    [provider]  omeprazole (PRILOSEC) 20 MG capsule Take 20 mg by mouth as needed.    [provider]    Family History Family History  Problem Relation Age of Onset  . Other Mother        hx of TAH-BSO in her late 20s-30 for unspecified cause  . Colon cancer Father        dx. early-mid-40s with later "recurrence"  . Ovarian cancer Sister 74  . Colon cancer Brother        dx. 65s; +surgery and chemotherapy   . Colon cancer Paternal Aunt 71  . Colon cancer Paternal Uncle 57  . Cancer Maternal Grandmother        NOS cancer, d. 63s  . Breast cancer Paternal Grandmother        d. early 54s  . Colon polyps Other        "many"; dx. late 11s or younger  . Other Daughter        breast issues--infection and fluid had to be removed; also has issues with on-going periods; dx. 15 or younger  . Other Daughter        cyst on her fallopian tube, being monitored; dx. 74 or younger  . Other Brother        issues with recent illness; bloodwork to check for cancer - age 60  . Other Brother        issues with swollen prostate; GI issues; age 62  . Cancer Cousin 54       paternal 1st cousin dx. "bowel" cancer  . Colon cancer Cousin 75       paternal 1st cousin  . Colon cancer Cousin 79       paternal 1st cousin  . Colon cancer Cousin 29       paternal 1st cousin  . Colon cancer Cousin        paternal 1st cousin at unspecified age  . Colon cancer Cousin 60       daughter of affected paternal 1st cousin  . Uterine cancer Cousin        paternal 1st cousin, d. 101s  . Schizophrenia Son   . Drug abuse Son   . Cancer Brother        paternal half-brother dx. in his 62s; NOS cancer  . Prostate cancer Brother        paternal half-brother d. 47s    Social History Social History   Tobacco Use  . Smoking status: Never Smoker  . Smokeless tobacco: Never Used  Substance Use Topics  . Alcohol use: No    Comment: hx of EtOH abuse; no alcohol now  . Drug use: No     Allergies   Patient has no known allergies.   Review of Systems Review of Systems  All other systems reviewed and are negative.    Physical Exam Updated Vital Signs LMP 12/29/2016   Physical Exam Vitals signs and nursing note reviewed.  Constitutional:      General: She is not in acute distress.    Appearance: She is well-developed.  HENT:     Head: Atraumatic.  Eyes:     Conjunctiva/sclera: Conjunctivae normal.  Neck:      Musculoskeletal: Neck supple.  Musculoskeletal:     Comments: Right hand and arm: Mildly decreased subjective sensation compared to left arm and hand diffusely.  Normal grip strength, radial pulse 2+,  able to provide full range of motion throughout all major joints in the wrist and hands and fingers.  Skin:    Findings: No rash.  Neurological:     Mental Status: She is alert.      ED Treatments / Results  Labs (all labs ordered are listed, but only abnormal results are displayed) Labs Reviewed  NOVEL CORONAVIRUS, NAA (HOSPITAL ORDER, SEND-OUT TO REF LAB)    EKG    Radiology No results found.  Procedures Procedures (including critical care time)  Medications Ordered in ED Medications - No data to display   Initial Impression / Assessment and Plan / ED Course  I have reviewed the triage vital signs and the nursing notes.  Pertinent labs & imaging results that were available during my care of the patient were reviewed by me and considered in my medical decision making (see chart for details).        BP 126/83 (BP Location: Right Arm)   Pulse 86   Temp 98.1 F (36.7 C) (Oral)   Resp 16   Wt 84.5 kg   LMP 12/29/2016   SpO2 96%   BMI 32.48 kg/m    Final Clinical Impressions(s) / ED Diagnoses   Final diagnoses:  Close Exposure to Covid-19 Virus  Numbness of right hand    ED Discharge Orders    None     1:05 PM Patient here with concern for potential COVID-19 exposure.  Husband recently worked with a coworker that test positive for COVID-19.  She does not have any viral symptoms however I will obtain COVID-19 testing per her request.  The test will take 40 hours to result.  I encourage patient to self quarantine for 14 days.  Appropriate instruction provided.  She also complaining of numbness sensation to her right wrist and hand ongoing for the past 2 to 3 weeks but it appears to be a progressive symptom for many years.  This is not an immediate emergent  condition.  She has normal grip strength, radial pulse 2+, no overlying skin changes.  Will refer to hand specialist for outpatient care.  Return precaution discussed.  Mckenzey Parcell Heslin was evaluated in Emergency Department on 01/07/2019 for the symptoms described in the history of present illness. She was evaluated in the context of the global COVID-19 pandemic, which necessitated consideration that the patient might be at risk for infection with the SARS-CoV-2 virus that causes COVID-19. Institutional protocols and algorithms that pertain to the evaluation of patients at risk for COVID-19 are in a state of rapid change based on information released by regulatory bodies including the CDC and federal and state organizations. These policies and algorithms were followed during the patient's care in the ED.    Domenic Moras, PA-C 01/07/19 [place], [place], MD 01/08/19 [phone]

## 2019-01-07 NOTE — ED Triage Notes (Signed)
Pt in with c/o weakness, states her husband has been recently exposed to Covid+. States she is also having R hand numbness x 2 wks. States this has been intermittently going on since seizures 12 yrs ago, but worse x past 2 wks. No other numbness

## 2019-01-09 ENCOUNTER — Telehealth (HOSPITAL_COMMUNITY): Payer: Self-pay

## 2019-01-09 LAB — NOVEL CORONAVIRUS, NAA (HOSP ORDER, SEND-OUT TO REF LAB; TAT 18-24 HRS): SARS-CoV-2, NAA: NOT DETECTED

## 2019-01-16 ENCOUNTER — Telehealth: Payer: Medicare HMO | Admitting: Family Medicine

## 2019-01-25 ENCOUNTER — Telehealth: Payer: Medicare HMO

## 2019-03-07 ENCOUNTER — Other Ambulatory Visit: Payer: Self-pay

## 2019-03-07 ENCOUNTER — Ambulatory Visit
Admission: EM | Admit: 2019-03-07 | Discharge: 2019-03-07 | Disposition: A | Discharge status: Home / Self Care | Payer: Medicare HMO | Attending: Emergency Medicine | Admitting: Emergency Medicine

## 2019-03-07 DIAGNOSIS — M62838 Other muscle spasm: Secondary | ICD-10-CM

## 2019-03-07 MED ORDER — CYCLOBENZAPRINE HCL 5 MG PO TABS
5.0000 mg | ORAL_TABLET | Freq: Two times a day (BID) | ORAL | 0 refills | Status: AC | PRN
Start: 2019-03-07 — End: 2019-03-12

## 2019-03-07 MED ORDER — LANSOPRAZOLE 30 MG PO CPDR: 30.0000 mg | DELAYED_RELEASE_CAPSULE | Freq: Every day | ORAL | 0 refills | Status: DC

## 2019-03-07 NOTE — ED Triage Notes (Signed)
Pt c/o numbness to rt arm, pain to mid upper back x2wks. Pt c/o acid reflux, pain on swallowing to the point she feels SOB when lying down x2wks, states can't sleep d/t all of this.

## 2019-03-07 NOTE — Discharge Instructions (Signed)
Take prevacid daily. Use muscle relaxer as needed. Return for worsening pain, shortness of breath, fever.

## 2019-03-07 NOTE — ED Provider Notes (Signed)
[name]    CSN: [id number] Arrival date & time: 03/07/19  1030     History   Chief Complaint Chief Complaint  Patient presents with  . Gastroesophageal Reflux    HPI [name] is a 53 y.o. female with history of depression, anxiety, GERD presenting for 2-week course of intermittent sharp, left upper back pain that radiates through to her chest.  Patient states that sometimes she will get right arm numbness and mid upper back pain, though is unable to correlate all these symptoms together.  Patient has been trying to take OTC Prilosec "most days "but has not noticed much relief.  Patient feels as though her concerned about her symptoms is keeping her up at night.  Patient endorses an infrequent dry cough that is without hemoptysis, shortness of breath.  Patient denies abdominal pain, nausea, vomiting, diarrhea.  Patient tried ibuprofen 200 mg with some relief of symptoms.    Past Medical History:  Diagnosis Date  . Arthritis    right leg  . Depression    no meds currently  . GERD (gastroesophageal reflux disease)   . Glaucoma   . MLH1-related Lynch syndrome (HNPCC2)   . Seizures ([place])    Only one back in 2004, none since, no meds, very stressed, patient just lost her husband  . SVD (spontaneous vaginal delivery)    x 4    Patient Active Problem List   Diagnosis Date Noted  . S/P laparoscopic assisted vaginal hysterectomy (LAVH) 03/17/2017  . Alcohol use disorder, moderate, dependence ([place]) 06/18/2016  . MDD (major depressive disorder), single episode, severe with psychotic features ([place]) 06/17/2016  . Genetic testing 03/28/2016  . MLH1-related Lynch syndrome (HNPCC2) 03/28/2016  . Family history of colon cancer 10/16/2015  . Family history of ovarian cancer 10/16/2015  . Family history of breast cancer in female 10/16/2015  . History of colonic polyps 10/16/2015    Past Surgical History:  Procedure Laterality Date  . ANKLE SURGERY Right 2001   skin graft and bone replacment from right hip muscle fats from abdomen  . CO2 LASER APPLICATION    . COLONOSCOPY WITH PROPOFOL N/A 02/09/2016   Procedure: COLONOSCOPY WITH PROPOFOL;  Surgeon: Garlan Fair, MD;  Location: [facility];  Service: Endoscopy;  Laterality: N/A;  . CYSTOSCOPY  03/17/2017   Procedure: CYSTOSCOPY;  Surgeon: Arvella Nigh, MD;  Location: [facility];  Service: Gynecology;;  . ESOPHAGOGASTRODUODENOSCOPY (EGD) WITH PROPOFOL N/A 02/09/2016   Procedure: ESOPHAGOGASTRODUODENOSCOPY (EGD) WITH PROPOFOL;  Surgeon: Garlan Fair, MD;  Location: [facility];  Service: Endoscopy;  Laterality: N/A;  . LAPAROSCOPIC VAGINAL HYSTERECTOMY WITH SALPINGO OOPHORECTOMY Bilateral 03/17/2017   Procedure: LAPAROSCOPIC ASSISTED VAGINAL HYSTERECTOMY WITH SALPINGO OOPHORECTOMY;  Surgeon: Arvella Nigh, MD;  Location: [facility];  Service: Gynecology;  Laterality: Bilateral;  . right arm surgery     x 2   . TUBAL LIGATION    . WISDOM TOOTH EXTRACTION      OB History   No obstetric history on file.      Home Medications    Prior to Admission medications   Medication Sig Start Date End Date Taking? Authorizing Provider  cyclobenzaprine (FLEXERIL) 5 MG tablet Take 1 tablet (5 mg total) by mouth 2 (two) times daily as needed for up to 5 days for muscle spasms. 03/07/19 03/12/19  Hall-Potvin, [place], PA-C  ibuprofen (ADVIL,MOTRIN) 200 MG tablet Take 200 mg by mouth every 6 (six) hours as needed (takes 2 as  needed).    [provider]  lansoprazole (PREVACID) 30 MG capsule Take 1 capsule (30 mg total) by mouth daily at 12 noon. 03/07/19   Hall-Potvin, [place], PA-C  omeprazole (PRILOSEC) 20 MG capsule Take 20 mg by mouth as needed.  03/07/19  [provider]    Family History Family History  Problem Relation Age of Onset  . Other Mother        hx of TAH-BSO in her late 20s-30 for unspecified cause  . Colon cancer Father        dx.  early-mid-40s with later "recurrence"  . Ovarian cancer Sister 21  . Colon cancer Brother        dx. 40s; +surgery and chemotherapy  . Colon cancer Paternal Aunt 75  . Colon cancer Paternal Uncle 67  . Cancer Maternal Grandmother        NOS cancer, d. 73s  . Breast cancer Paternal Grandmother        d. early 63s  . Colon polyps Other        "many"; dx. late 38s or younger  . Other Daughter        breast issues--infection and fluid had to be removed; also has issues with on-going periods; dx. 15 or younger  . Other Daughter        cyst on her fallopian tube, being monitored; dx. 70 or younger  . Other Brother        issues with recent illness; bloodwork to check for cancer - age 36  . Other Brother        issues with swollen prostate; GI issues; age 38  . Cancer Cousin 57       paternal 1st cousin dx. "bowel" cancer  . Colon cancer Cousin 69       paternal 1st cousin  . Colon cancer Cousin 78       paternal 1st cousin  . Colon cancer Cousin 29       paternal 1st cousin  . Colon cancer Cousin        paternal 1st cousin at unspecified age  . Colon cancer Cousin 86       daughter of affected paternal 1st cousin  . Uterine cancer Cousin        paternal 1st cousin, d. 52s  . Schizophrenia Son   . Drug abuse Son   . Cancer Brother        paternal half-brother dx. in his 65s; NOS cancer  . Prostate cancer Brother        paternal half-brother d. 77s    Social History Social History   Tobacco Use  . Smoking status: Never Smoker  . Smokeless tobacco: Never Used  Substance Use Topics  . Alcohol use: No    Comment: hx of EtOH abuse; no alcohol now  . Drug use: No     Allergies   Patient has no known allergies.   Review of Systems Review of Systems  Constitutional: Negative for fatigue and fever.  HENT: Negative for ear pain, sinus pain, sore throat and voice change.   Eyes: Negative for pain, redness and visual disturbance.  Respiratory: Negative for cough and  shortness of breath.   Cardiovascular: Positive for chest pain. Negative for palpitations.  Gastrointestinal: Negative for abdominal pain, diarrhea and vomiting.  Musculoskeletal: Negative for arthralgias and myalgias.  Skin: Negative for rash and wound.  Neurological: Negative for syncope and headaches.     Physical Exam Triage Vital Signs  ED Triage Vitals  Enc Vitals Group     BP 03/07/19 1045 121/74     Pulse Rate 03/07/19 1045 76     Resp 03/07/19 1045 18     Temp 03/07/19 1045 98.1 F (36.7 C)     Temp Source 03/07/19 1045 Oral     SpO2 03/07/19 1045 97 %     Weight --      Height --      Head Circumference --      Peak Flow --      Pain Score 03/07/19 1046 0     Pain Loc --      Pain Edu? --      Excl. in [place]? --    No data found.  Updated Vital Signs BP 121/74 (BP Location: Left Arm)   Pulse 76   Temp 98.1 F (36.7 C) (Oral)   Resp 18   LMP 12/29/2016   SpO2 97%   Visual Acuity Right Eye Distance:   Left Eye Distance:   Bilateral Distance:    Right Eye Near:   Left Eye Near:    Bilateral Near:     Physical Exam Vitals signs reviewed.  Constitutional:      General: She is not in acute distress.    Appearance: She is normal weight. She is not ill-appearing.  HENT:     Head: Normocephalic and atraumatic.     Mouth/Throat:     Mouth: Mucous membranes are moist.     Pharynx: Oropharynx is clear. No oropharyngeal exudate or posterior oropharyngeal erythema.  Eyes:     General: No scleral icterus.       Right eye: No discharge.        Left eye: No discharge.     Extraocular Movements: Extraocular movements intact.     Pupils: Pupils are equal, round, and reactive to light.  Neck:     Musculoskeletal: Normal range of motion and neck supple. No muscular tenderness.  Cardiovascular:     Rate and Rhythm: Normal rate and regular rhythm.     Pulses: Normal pulses.     Heart sounds: No murmur.  Pulmonary:     Effort: Pulmonary effort is normal. No  respiratory distress.     Breath sounds: No wheezing.  Chest:     Chest wall: No tenderness.  Abdominal:     General: Abdomen is flat.     Palpations: Abdomen is soft.     Tenderness: There is no abdominal tenderness. There is no guarding.  Musculoskeletal: Normal range of motion.        General: No swelling or deformity.     Right lower leg: No edema.     Left lower leg: No edema.     Comments: Left mid paraspinal muscle tender palpation.  No vertebral tenderness.  No chest tenderness.  Lymphadenopathy:     Cervical: No cervical adenopathy.  Skin:    General: Skin is warm.     Capillary Refill: Capillary refill takes less than 2 seconds.     Coloration: Skin is not jaundiced or pale.     Findings: No rash.  Neurological:     General: No focal deficit present.     Mental Status: She is alert and oriented to person, place, and time.  Psychiatric:        Mood and Affect: Mood normal.        Thought Content: Thought content normal.      UC Treatments /  Results  Labs (all labs ordered are listed, but only abnormal results are displayed) Labs Reviewed - No data to display  EKG   Radiology No results found.  Procedures Procedures (including critical care time)  Medications Ordered in UC Medications - No data to display  Initial Impression / Assessment and Plan / UC Course  I have reviewed the triage vital signs and the nursing notes.  Pertinent labs & imaging results that were available during my care of the patient were reviewed by me and considered in my medical decision making (see chart for details).     1.  Muscle spasm left upper back EKG done in office, reviewed by me and compared to previous from 06/18/2016: Normal sinus rhythm without QTC prolongation, ST elevation.  Waveforms stable in all leads.  Discussed findings with patient who verbalized relief.  Will trial lansoprazole and emphasize importance of daily compliance.  Will provide short-term low-dose  muscle relaxer for hypertonicity/spasm.  Return precautions discussed, patient verbalized understanding and is agreeable to plan. Final Clinical Impressions(s) / UC Diagnoses   Final diagnoses:  Muscle spasm     Discharge Instructions     Take prevacid daily. Use muscle relaxer as needed. Return for worsening pain, shortness of breath, fever.    ED Prescriptions    Medication Sig Dispense Auth. Provider   lansoprazole (PREVACID) 30 MG capsule Take 1 capsule (30 mg total) by mouth daily at 12 noon. 30 capsule Hall-Potvin, [place], PA-C   cyclobenzaprine (FLEXERIL) 5 MG tablet Take 1 tablet (5 mg total) by mouth 2 (two) times daily as needed for up to 5 days for muscle spasms. 10 tablet Hall-Potvin, [place], PA-C     Controlled Substance Prescriptions [place] Controlled Substance Registry consulted? Not Applicable   Quincy Sheehan, [place] 03/07/19 1703

## 2019-04-11 ENCOUNTER — Other Ambulatory Visit: Payer: Self-pay | Admitting: Gastroenterology

## 2019-04-11 DIAGNOSIS — R634 Abnormal weight loss: Secondary | ICD-10-CM

## 2019-04-11 DIAGNOSIS — Z1509 Genetic susceptibility to other malignant neoplasm: Secondary | ICD-10-CM

## 2019-04-11 DIAGNOSIS — R1013 Epigastric pain: Secondary | ICD-10-CM

## 2019-04-19 ENCOUNTER — Ambulatory Visit
Admission: RE | Admit: 2019-04-19 | Discharge: 2019-04-19 | Disposition: A | Discharge status: Home / Self Care | Payer: Medicare HMO | Source: Ambulatory Visit | Attending: Gastroenterology | Admitting: Gastroenterology

## 2019-04-19 DIAGNOSIS — R634 Abnormal weight loss: Secondary | ICD-10-CM

## 2019-04-19 DIAGNOSIS — R1013 Epigastric pain: Secondary | ICD-10-CM

## 2019-04-19 DIAGNOSIS — Z1509 Genetic susceptibility to other malignant neoplasm: Secondary | ICD-10-CM

## 2019-04-19 MED ORDER — IOPAMIDOL (ISOVUE-300) INJECTION 61%
100.0000 mL | Freq: Once | INTRAVENOUS | Status: AC | PRN
  Administered 2019-04-19: 13:00:00 100 mL via INTRAVENOUS

## 2019-07-25 ENCOUNTER — Ambulatory Visit: Payer: Medicare HMO

## 2019-08-25 ENCOUNTER — Encounter: Payer: Self-pay | Admitting: Physician Assistant

## 2019-08-25 ENCOUNTER — Other Ambulatory Visit: Payer: Self-pay

## 2019-08-25 ENCOUNTER — Ambulatory Visit
Admission: EM | Admit: 2019-08-25 | Discharge: 2019-08-25 | Disposition: A | Discharge status: Home / Self Care | Payer: Medicare HMO | Attending: Physician Assistant | Admitting: Physician Assistant

## 2019-08-25 DIAGNOSIS — R197 Diarrhea, unspecified: Secondary | ICD-10-CM

## 2019-08-25 DIAGNOSIS — R05 Cough: Secondary | ICD-10-CM | POA: Diagnosis not present

## 2019-08-25 DIAGNOSIS — R0981 Nasal congestion: Secondary | ICD-10-CM

## 2019-08-25 DIAGNOSIS — R112 Nausea with vomiting, unspecified: Secondary | ICD-10-CM | POA: Diagnosis not present

## 2019-08-25 DIAGNOSIS — R059 Cough, unspecified: Secondary | ICD-10-CM

## 2019-08-25 DIAGNOSIS — Z20822 Contact with and (suspected) exposure to covid-19: Secondary | ICD-10-CM | POA: Diagnosis not present

## 2019-08-25 MED ORDER — ONDANSETRON 4 MG PO TBDP: 4.0000 mg | ORAL_TABLET | Freq: Three times a day (TID) | ORAL | 0 refills | Status: DC | PRN

## 2019-08-25 MED ORDER — FLUTICASONE PROPIONATE 50 MCG/ACT NA SUSP: 2.0000 | Freq: Every day | NASAL | 0 refills | Status: DC

## 2019-08-25 MED ORDER — OMEPRAZOLE 20 MG PO CPDR: 20.0000 mg | DELAYED_RELEASE_CAPSULE | Freq: Every day | ORAL | 0 refills | Status: DC

## 2019-08-25 NOTE — Discharge Instructions (Signed)
COVID PCR testing ordered. I would like you to quarantine until testing results. Omeprazole for acid reflux. Zofran for nausea/vomiting. Flonase for nasal congestion/allergies. Tylenol/motrin for pain and fever. Keep hydrated, urine should be clear to pale yellow in color. Bland diet, try to avoid fatty foods/spicy foods for now. If experiencing shortness of breath, trouble breathing, go to the emergency department for further evaluation needed.

## 2019-08-25 NOTE — ED Triage Notes (Signed)
Pt sts vomiting after eating only x 3 days; pt sts some cough; pt sts was around her mother after 14 day quarantine for covid

## 2019-08-25 NOTE — ED Provider Notes (Signed)
[name]    CSN: [id number] Arrival date & time: 08/25/19  0920     History   Chief Complaint Chief Complaint  Patient presents with  . Emesis  . Cough    HPI [name] is a 54 y.o. female.   54 year old female comes in for 3 day history of nausea, vomiting, diarrhea. Epigastric pain, churning in pain, worse after eating, BM. Rhinorrhea, cough. Denies sore throat. Chills without known fever. No body aches. Denies shortness of breath, loss of taste/smell. Took imodium for diarrhea. Never smoker. Denies chronic NSAID use. Occasional alcohol use. Denies new medication change. No drug use. States saw her mother, who was positive for COVID, but had finished 14 day quarantine before she visited.      Past Medical History:  Diagnosis Date  . Arthritis    right leg  . Depression    no meds currently  . GERD (gastroesophageal reflux disease)   . Glaucoma   . MLH1-related Lynch syndrome (HNPCC2)   . Seizures ([place])    Only one back in 2004, none since, no meds, very stressed, patient just lost her husband  . SVD (spontaneous vaginal delivery)    x 4    Patient Active Problem List   Diagnosis Date Noted  . S/P laparoscopic assisted vaginal hysterectomy (LAVH) 03/17/2017  . Alcohol use disorder, moderate, dependence ([place]) 06/18/2016  . MDD (major depressive disorder), single episode, severe with psychotic features ([place]) 06/17/2016  . Genetic testing 03/28/2016  . MLH1-related Lynch syndrome (HNPCC2) 03/28/2016  . Family history of colon cancer 10/16/2015  . Family history of ovarian cancer 10/16/2015  . Family history of breast cancer in female 10/16/2015  . History of colonic polyps 10/16/2015    Past Surgical History:  Procedure Laterality Date  . ANKLE SURGERY Right 2001   skin graft and bone replacment from right hip muscle fats from abdomen  . CO2 LASER APPLICATION    . COLONOSCOPY WITH PROPOFOL N/A 02/09/2016   Procedure: COLONOSCOPY WITH PROPOFOL;   Surgeon: Garlan Fair, MD;  Location: [facility];  Service: Endoscopy;  Laterality: N/A;  . CYSTOSCOPY  03/17/2017   Procedure: CYSTOSCOPY;  Surgeon: Arvella Nigh, MD;  Location: [facility];  Service: Gynecology;;  . ESOPHAGOGASTRODUODENOSCOPY (EGD) WITH PROPOFOL N/A 02/09/2016   Procedure: ESOPHAGOGASTRODUODENOSCOPY (EGD) WITH PROPOFOL;  Surgeon: Garlan Fair, MD;  Location: [facility];  Service: Endoscopy;  Laterality: N/A;  . LAPAROSCOPIC VAGINAL HYSTERECTOMY WITH SALPINGO OOPHORECTOMY Bilateral 03/17/2017   Procedure: LAPAROSCOPIC ASSISTED VAGINAL HYSTERECTOMY WITH SALPINGO OOPHORECTOMY;  Surgeon: Arvella Nigh, MD;  Location: [facility];  Service: Gynecology;  Laterality: Bilateral;  . right arm surgery     x 2   . TUBAL LIGATION    . WISDOM TOOTH EXTRACTION      OB History   No obstetric history on file.      Home Medications    Prior to Admission medications   Medication Sig Start Date End Date Taking? Authorizing Provider  fluticasone (FLONASE) 50 MCG/ACT nasal spray Place 2 sprays into both nostrils daily. 08/25/19   Tasia Catchings, Amy V, PA-C  omeprazole (PRILOSEC) 20 MG capsule Take 1 capsule (20 mg total) by mouth daily. 08/25/19   Tasia Catchings, Amy V, PA-C  ondansetron (ZOFRAN ODT) 4 MG disintegrating tablet Take 1 tablet (4 mg total) by mouth every 8 (eight) hours as needed for nausea or vomiting. 08/25/19   Ok Edwards, PA-C    Family History  Family History  Problem Relation Age of Onset  . Other Mother        hx of TAH-BSO in her late 20s-30 for unspecified cause  . Colon cancer Father        dx. early-mid-40s with later "recurrence"  . Ovarian cancer Sister 54  . Colon cancer Brother        dx. 57s; +surgery and chemotherapy  . Colon cancer Paternal Aunt 58  . Colon cancer Paternal Uncle 31  . Cancer Maternal Grandmother        NOS cancer, d. 33s  . Breast cancer Paternal Grandmother        d. early 20s  . Colon polyps Other        "many"; dx.  late 9s or younger  . Other Daughter        breast issues--infection and fluid had to be removed; also has issues with on-going periods; dx. 15 or younger  . Other Daughter        cyst on her fallopian tube, being monitored; dx. 13 or younger  . Other Brother        issues with recent illness; bloodwork to check for cancer - age 41  . Other Brother        issues with swollen prostate; GI issues; age 42  . Cancer Cousin 56       paternal 1st cousin dx. "bowel" cancer  . Colon cancer Cousin 64       paternal 1st cousin  . Colon cancer Cousin 29       paternal 1st cousin  . Colon cancer Cousin 38       paternal 1st cousin  . Colon cancer Cousin        paternal 1st cousin at unspecified age  . Colon cancer Cousin 34       daughter of affected paternal 1st cousin  . Uterine cancer Cousin        paternal 1st cousin, d. 77s  . Schizophrenia Son   . Drug abuse Son   . Cancer Brother        paternal half-brother dx. in his 43s; NOS cancer  . Prostate cancer Brother        paternal half-brother d. 57s    Social History Social History   Tobacco Use  . Smoking status: Never Smoker  . Smokeless tobacco: Never Used  Substance Use Topics  . Alcohol use: No    Comment: hx of EtOH abuse; no alcohol now  . Drug use: No     Allergies   Patient has no known allergies.   Review of Systems Review of Systems  Reason unable to perform ROS: See HPI as above.     Physical Exam Triage Vital Signs ED Triage Vitals  Enc Vitals Group     BP 08/25/19 0934 131/76     Pulse Rate 08/25/19 0934 76     Resp 08/25/19 0934 18     Temp 08/25/19 0934 98.5 F (36.9 C)     Temp Source 08/25/19 0934 Oral     SpO2 08/25/19 0934 95 %     Weight --      Height --      Head Circumference --      Peak Flow --      Pain Score 08/25/19 0935 5     Pain Loc --      Pain Edu? --      Excl. in [place]? --  No data found.  Updated Vital Signs BP 131/76 (BP Location: Right Arm)   Pulse 76   Temp  98.5 F (36.9 C) (Oral)   Resp 18   LMP 12/29/2016   SpO2 95%   Physical Exam Constitutional:      General: She is not in acute distress.    Appearance: She is well-developed. She is not ill-appearing, toxic-appearing or diaphoretic.  HENT:     Head: Normocephalic and atraumatic.  Eyes:     Conjunctiva/sclera: Conjunctivae normal.     Pupils: Pupils are equal, round, and reactive to light.  Cardiovascular:     Rate and Rhythm: Normal rate and regular rhythm.  Pulmonary:     Effort: Pulmonary effort is normal. No respiratory distress.     Comments: LCTAB Abdominal:     General: Bowel sounds are increased.     Palpations: Abdomen is soft.     Tenderness: There is no abdominal tenderness. There is no right CVA tenderness, left CVA tenderness, guarding or rebound.     Comments: Large vertical scar along right side abdomen. Patient states fat was removed due to surgery for the ankle.   Musculoskeletal:     Cervical back: Normal range of motion and neck supple.  Skin:    General: Skin is warm and dry.  Neurological:     Mental Status: She is alert and oriented to person, place, and time.  Psychiatric:        Behavior: Behavior normal.        Judgment: Judgment normal.      UC Treatments / Results  Labs (all labs ordered are listed, but only abnormal results are displayed) Labs Reviewed  NOVEL CORONAVIRUS, NAA    EKG   Radiology No results found.  Procedures Procedures (including critical care time)  Medications Ordered in UC Medications - No data to display  Initial Impression / Assessment and Plan / UC Course  I have reviewed the triage vital signs and the nursing notes.  Pertinent labs & imaging results that were available during my care of the patient were reviewed by me and considered in my medical decision making (see chart for details).    COVID PCR test ordered. Patient to quarantine until testing results return. No alarming signs on exam.  Patient  speaking in full sentences without respiratory distress. Low suspicion for liver disease, pancreatitis causing symptoms at this time.  Symptomatic treatment discussed.  Push fluids.  Return precautions given.  Patient expresses understanding and agrees to plan.  Final Clinical Impressions(s) / UC Diagnoses   Final diagnoses:  Nausea vomiting and diarrhea  Cough  Nasal congestion    ED Prescriptions    Medication Sig Dispense Auth. Provider   ondansetron (ZOFRAN ODT) 4 MG disintegrating tablet Take 1 tablet (4 mg total) by mouth every 8 (eight) hours as needed for nausea or vomiting. 20 tablet Yu, Amy V, PA-C   omeprazole (PRILOSEC) 20 MG capsule Take 1 capsule (20 mg total) by mouth daily. 15 capsule Yu, Amy V, PA-C   fluticasone (FLONASE) 50 MCG/ACT nasal spray Place 2 sprays into both nostrils daily. 1 g Ok Edwards, PA-C     PDMP not reviewed this encounter.   Ok Edwards, PA-C 08/25/19 1054

## 2019-08-26 LAB — NOVEL CORONAVIRUS, NAA: SARS-CoV-2, NAA: NOT DETECTED

## 2019-08-27 ENCOUNTER — Telehealth: Payer: Self-pay | Admitting: Emergency Medicine

## 2019-08-27 NOTE — Telephone Encounter (Signed)
Patient called to receive lab results.  Confirmed identity using two identifiers and provided negative result.  Patient verbalized understanding.

## 2019-10-01 ENCOUNTER — Inpatient Hospital Stay: Payer: Medicare HMO

## 2019-10-05 ENCOUNTER — Inpatient Hospital Stay: Payer: Medicare HMO

## 2020-01-28 ENCOUNTER — Encounter: Payer: Self-pay | Admitting: Emergency Medicine

## 2020-01-28 ENCOUNTER — Ambulatory Visit
Admission: EM | Admit: 2020-01-28 | Discharge: 2020-01-28 | Disposition: A | Discharge status: Home / Self Care | Payer: Medicare HMO | Attending: Emergency Medicine | Admitting: Emergency Medicine

## 2020-01-28 ENCOUNTER — Other Ambulatory Visit: Payer: Self-pay

## 2020-01-28 DIAGNOSIS — K047 Periapical abscess without sinus: Secondary | ICD-10-CM

## 2020-01-28 MED ORDER — METHYLPREDNISOLONE SODIUM SUCC 125 MG IJ SOLR
125.0000 mg | Freq: Once | INTRAMUSCULAR | Status: AC
  Administered 2020-01-28: 16:00:00 125 mg via INTRAMUSCULAR

## 2020-01-28 MED ORDER — DICLOFENAC SODIUM 75 MG PO TBEC: 75.0000 mg | DELAYED_RELEASE_TABLET | Freq: Two times a day (BID) | ORAL | 0 refills | Status: DC

## 2020-01-28 MED ORDER — AMOXICILLIN-POT CLAVULANATE 875-125 MG PO TABS: 1.0000 | ORAL_TABLET | Freq: Two times a day (BID) | ORAL | 0 refills | Status: DC

## 2020-01-28 NOTE — ED Triage Notes (Signed)
Pt presents to [facility] ([facility]) for assessment of facial swelling and pain since yesterday.  Patient denies specific dental pain.  States it is giving a headache, and her nostril is sore.

## 2020-01-28 NOTE — Discharge Instructions (Addendum)
Take antibiotic as prescribed with food - important to complete course. Return for worsening pain, redness, swelling, discharge, fever. 

## 2020-01-28 NOTE — ED Provider Notes (Signed)
[name]    CSN: [id number] Arrival date & time: 01/28/20  1500      History   Chief Complaint Chief Complaint  Patient presents with  . Facial Swelling    HPI [name] is a 54 y.o. female with history of arthritis, MLH1 related Lynch syndrome, GERD presenting for left-sided facial pain and swelling.  States is been ongoing for 2 days.  No dental pain, sore throat, ear pain, difficulty breathing or swallowing.  Denies fever, arthralgias, myalgias, rash.  Has tried OTC medications without relief.  States that she had dental work done different left canine 3 weeks ago: Has temporary implant in place.  Has not noticed any issues with this.   Past Medical History:  Diagnosis Date  . Arthritis    right leg  . Depression    no meds currently  . GERD (gastroesophageal reflux disease)   . Glaucoma   . MLH1-related Lynch syndrome (HNPCC2)   . Seizures ([place])    Only one back in 2004, none since, no meds, very stressed, patient just lost her husband  . SVD (spontaneous vaginal delivery)    x 4    Patient Active Problem List   Diagnosis Date Noted  . S/P laparoscopic assisted vaginal hysterectomy (LAVH) 03/17/2017  . Alcohol use disorder, moderate, dependence ([place]) 06/18/2016  . MDD (major depressive disorder), single episode, severe with psychotic features ([place]) 06/17/2016  . Genetic testing 03/28/2016  . MLH1-related Lynch syndrome (HNPCC2) 03/28/2016  . Family history of colon cancer 10/16/2015  . Family history of ovarian cancer 10/16/2015  . Family history of breast cancer in female 10/16/2015  . History of colonic polyps 10/16/2015    Past Surgical History:  Procedure Laterality Date  . ANKLE SURGERY Right 2001   skin graft and bone replacment from right hip muscle fats from abdomen  . CO2 LASER APPLICATION    . COLONOSCOPY WITH PROPOFOL N/A 02/09/2016   Procedure: COLONOSCOPY WITH PROPOFOL;  Surgeon: Garlan Fair, MD;  Location: [facility];   Service: Endoscopy;  Laterality: N/A;  . CYSTOSCOPY  03/17/2017   Procedure: CYSTOSCOPY;  Surgeon: Arvella Nigh, MD;  Location: [facility];  Service: Gynecology;;  . ESOPHAGOGASTRODUODENOSCOPY (EGD) WITH PROPOFOL N/A 02/09/2016   Procedure: ESOPHAGOGASTRODUODENOSCOPY (EGD) WITH PROPOFOL;  Surgeon: Garlan Fair, MD;  Location: [facility];  Service: Endoscopy;  Laterality: N/A;  . LAPAROSCOPIC VAGINAL HYSTERECTOMY WITH SALPINGO OOPHORECTOMY Bilateral 03/17/2017   Procedure: LAPAROSCOPIC ASSISTED VAGINAL HYSTERECTOMY WITH SALPINGO OOPHORECTOMY;  Surgeon: Arvella Nigh, MD;  Location: [facility];  Service: Gynecology;  Laterality: Bilateral;  . right arm surgery     x 2   . TUBAL LIGATION    . WISDOM TOOTH EXTRACTION      OB History   No obstetric history on file.      Home Medications    Prior to Admission medications   Medication Sig Start Date End Date Taking? Authorizing Provider  amoxicillin-clavulanate (AUGMENTIN) 875-125 MG tablet Take 1 tablet by mouth every 12 (twelve) hours. 01/28/20   Hall-Potvin, [place], PA-C  diclofenac (VOLTAREN) 75 MG EC tablet Take 1 tablet (75 mg total) by mouth 2 (two) times daily. 01/28/20   Hall-Potvin, [place], PA-C  fluticasone (FLONASE) 50 MCG/ACT nasal spray Place 2 sprays into both nostrils daily. 08/25/19   Tasia Catchings, Amy V, PA-C  omeprazole (PRILOSEC) 20 MG capsule Take 1 capsule (20 mg total) by mouth daily. 08/25/19   Ok Edwards, PA-C  Family History Family History  Problem Relation Age of Onset  . Other Mother        hx of TAH-BSO in her late 20s-30 for unspecified cause  . Colon cancer Father        dx. early-mid-40s with later "recurrence"  . Ovarian cancer Sister 21  . Colon cancer Brother        dx. 59s; +surgery and chemotherapy  . Colon cancer Paternal Aunt 78  . Colon cancer Paternal Uncle 54  . Cancer Maternal Grandmother        NOS cancer, d. 79s  . Breast cancer Paternal Grandmother        d.  early 7s  . Colon polyps Other        "many"; dx. late 10s or younger  . Other Daughter        breast issues--infection and fluid had to be removed; also has issues with on-going periods; dx. 15 or younger  . Other Daughter        cyst on her fallopian tube, being monitored; dx. 32 or younger  . Other Brother        issues with recent illness; bloodwork to check for cancer - age 82  . Other Brother        issues with swollen prostate; GI issues; age 36  . Cancer Cousin 56       paternal 1st cousin dx. "bowel" cancer  . Colon cancer Cousin 35       paternal 1st cousin  . Colon cancer Cousin 25       paternal 1st cousin  . Colon cancer Cousin 44       paternal 1st cousin  . Colon cancer Cousin        paternal 1st cousin at unspecified age  . Colon cancer Cousin 72       daughter of affected paternal 1st cousin  . Uterine cancer Cousin        paternal 1st cousin, d. 62s  . Schizophrenia Son   . Drug abuse Son   . Cancer Brother        paternal half-brother dx. in his 56s; NOS cancer  . Prostate cancer Brother        paternal half-brother d. 54s    Social History Social History   Tobacco Use  . Smoking status: Never Smoker  . Smokeless tobacco: Never Used  Vaping Use  . Vaping Use: Never used  Substance Use Topics  . Alcohol use: No    Comment: hx of EtOH abuse; no alcohol now  . Drug use: No     Allergies   Patient has no known allergies.   Review of Systems As per HPI   Physical Exam Triage Vital Signs ED Triage Vitals  Enc Vitals Group     BP      Pulse      Resp      Temp      Temp src      SpO2      Weight      Height      Head Circumference      Peak Flow      Pain Score      Pain Loc      Pain Edu?      Excl. in [place]?    No data found.  Updated Vital Signs BP 129/79 (BP Location: Left Arm)   Pulse 89   Temp 98 F (36.7 C) (Temporal)  Resp 18   LMP 12/29/2016   SpO2 96%   Visual Acuity Right Eye Distance:   Left Eye Distance:    Bilateral Distance:    Right Eye Near:   Left Eye Near:    Bilateral Near:     Physical Exam Constitutional:      General: She is not in acute distress. HENT:     Head: Normocephalic and atraumatic.     Jaw: There is normal jaw occlusion. No tenderness or pain on movement.     Right Ear: Hearing, tympanic membrane, ear canal and external ear normal. No tenderness. No mastoid tenderness.     Left Ear: Hearing, tympanic membrane, ear canal and external ear normal. No tenderness. No mastoid tenderness.     Nose: No nasal deformity, septal deviation or nasal tenderness.     Right Turbinates: Not swollen or pale.     Left Turbinates: Not swollen or pale.     Right Sinus: No maxillary sinus tenderness or frontal sinus tenderness.     Left Sinus: No maxillary sinus tenderness or frontal sinus tenderness.     Mouth/Throat:     Lips: Pink. No lesions.     Mouth: Mucous membranes are moist. No injury.     Pharynx: Oropharynx is clear. Uvula midline. No posterior oropharyngeal erythema or uvula swelling.     Comments: no tonsillar exudate or hypertrophy.  Good dentition without gingival edema or erythema.  No cracked teeth.  Upper left canine with mild TTP.  Implant stable. Eyes:     General: No scleral icterus.    Conjunctiva/sclera: Conjunctivae normal.     Pupils: Pupils are equal, round, and reactive to light.  Cardiovascular:     Rate and Rhythm: Normal rate and regular rhythm.  Pulmonary:     Effort: Pulmonary effort is normal. No respiratory distress.     Breath sounds: No wheezing.  Musculoskeletal:     Cervical back: Normal range of motion and neck supple. No tenderness. No muscular tenderness.  Lymphadenopathy:     Cervical: No cervical adenopathy.  Skin:    Capillary Refill: Capillary refill takes less than 2 seconds.     Comments: Swelling & tenderness over left upper lip extending towards the nasolabial fold.  No nasal involvement.  Neurological:     General: No focal  deficit present.     Mental Status: She is alert and oriented to person, place, and time.      UC Treatments / Results  Labs (all labs ordered are listed, but only abnormal results are displayed) Labs Reviewed - No data to display  EKG   Radiology No results found.  Procedures Procedures (including critical care time)  Medications Ordered in UC Medications  methylPREDNISolone sodium succinate (SOLU-MEDROL) 125 mg/2 mL injection 125 mg (125 mg Intramuscular Given 01/28/20 1534)    Initial Impression / Assessment and Plan / UC Course  I have reviewed the triage vital signs and the nursing notes.  Pertinent labs & imaging results that were available during my care of the patient were reviewed by me and considered in my medical decision making (see chart for details).     Patient febrile, nontoxic in office today.  H&P concerning for facial cellulitis secondary to dental infection.  Will start Augmentin today, give Solu-Medrol in office (tolerated well), and have patient follow-up with her dentist.  Return precautions discussed, patient verbalized understanding and is agreeable to plan. Final Clinical Impressions(s) / UC Diagnoses   Final diagnoses:  Dental infection     Discharge Instructions     Take antibiotic as prescribed with food - important to complete course. Return for worsening pain, redness, swelling, discharge, fever.    ED Prescriptions    Medication Sig Dispense Auth. Provider   amoxicillin-clavulanate (AUGMENTIN) 875-125 MG tablet  (Status: Discontinued) Take 1 tablet by mouth every 12 (twelve) hours. 14 tablet Hall-Potvin, [place], PA-C   diclofenac (VOLTAREN) 75 MG EC tablet  (Status: Discontinued) Take 1 tablet (75 mg total) by mouth 2 (two) times daily. 14 tablet Hall-Potvin, [place], PA-C   amoxicillin-clavulanate (AUGMENTIN) 875-125 MG tablet Take 1 tablet by mouth every 12 (twelve) hours. 14 tablet Hall-Potvin, [place], PA-C   diclofenac  (VOLTAREN) 75 MG EC tablet Take 1 tablet (75 mg total) by mouth 2 (two) times daily. 14 tablet Hall-Potvin, [place], PA-C     I have reviewed the PDMP during this encounter.   Hall-Potvin, [place], [place] 01/28/20 1618

## 2020-01-28 NOTE — ED Notes (Signed)
Patient able to ambulate independently  

## 2020-03-11 ENCOUNTER — Other Ambulatory Visit: Payer: Self-pay | Admitting: Obstetrics and Gynecology

## 2020-03-11 DIAGNOSIS — N6001 Solitary cyst of right breast: Secondary | ICD-10-CM

## 2020-03-11 DIAGNOSIS — N6489 Other specified disorders of breast: Secondary | ICD-10-CM

## 2020-03-12 ENCOUNTER — Ambulatory Visit: Payer: Medicare Other | Admitting: Podiatry

## 2020-03-19 ENCOUNTER — Other Ambulatory Visit: Payer: Medicare HMO

## 2020-05-25 IMAGING — CT CT ABD-PELV W/ CM
2 of 5 series · 13 of 46 positions shown, 15 images · IV contrast (iopamidol)
Comparison: 03/27/2008

CLINICAL DATA: Abnormal weight loss, epigastric pain, constipation

EXAM:
CT ABDOMEN AND PELVIS WITH CONTRAST
TECHNIQUE: Multidetector CT imaging of the abdomen and pelvis was performed
using the standard protocol following bolus administration of
intravenous contrast.
CONTRAST:  100mL [id number] IOPAMIDOL ([id number]) INJECTION 61%,
additional oral enteric contrast

[Series 2: abd pelvis 5.00 br40 s3 axial · axial · 0.63mm/px · z∈[+1210,+1600]mm · 10 of 88 slices shown, 12 images]
[im 5/88  soft-tissue]
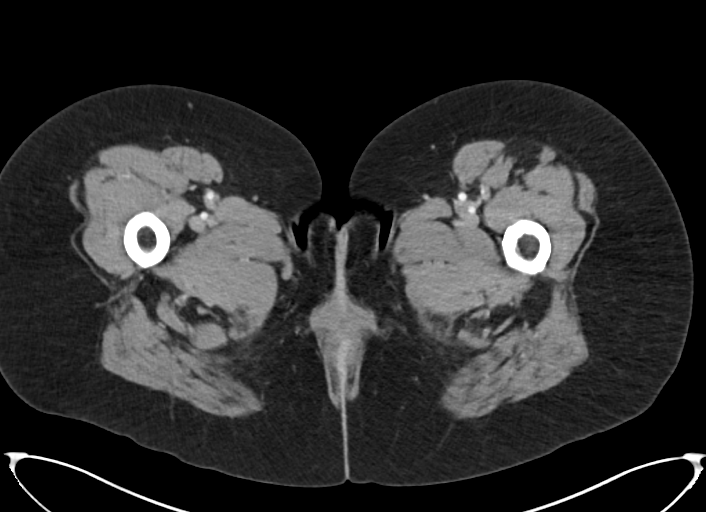
[im 5/88  bone]
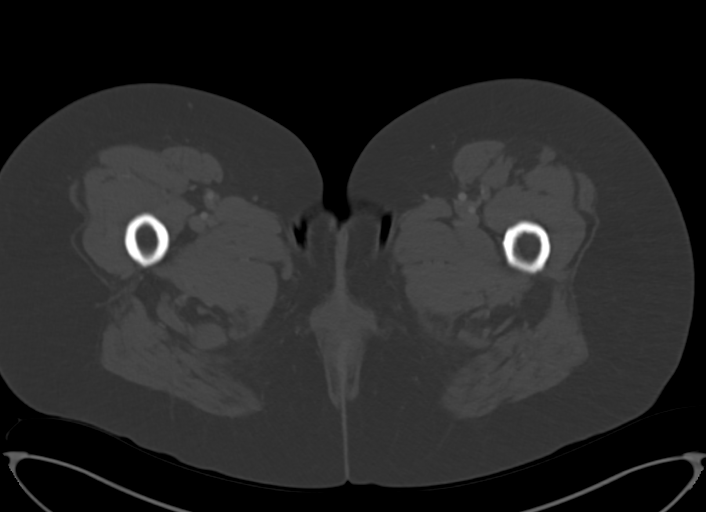
[im 15/88  soft-tissue]
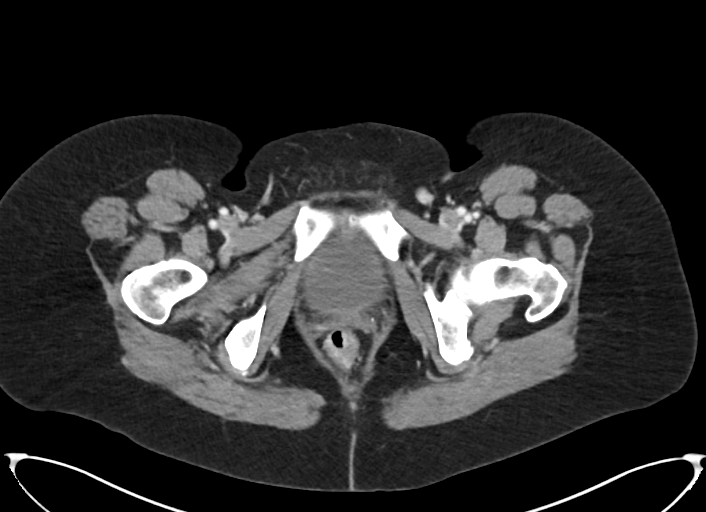
[im 25/88  soft-tissue]
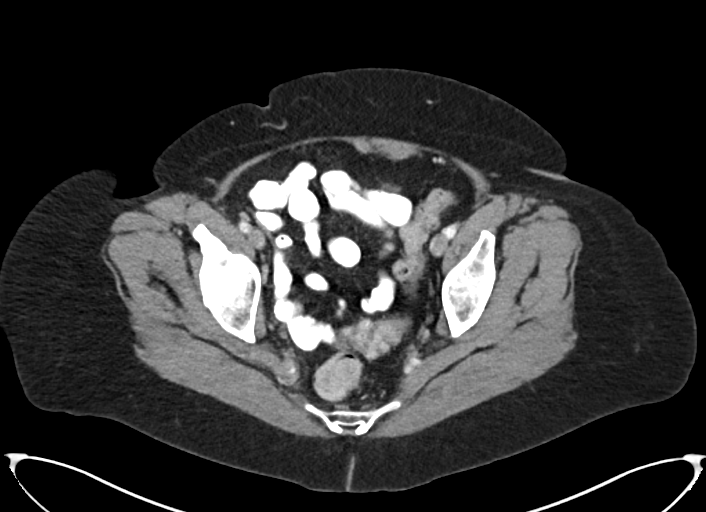
[im 30/88  soft-tissue]
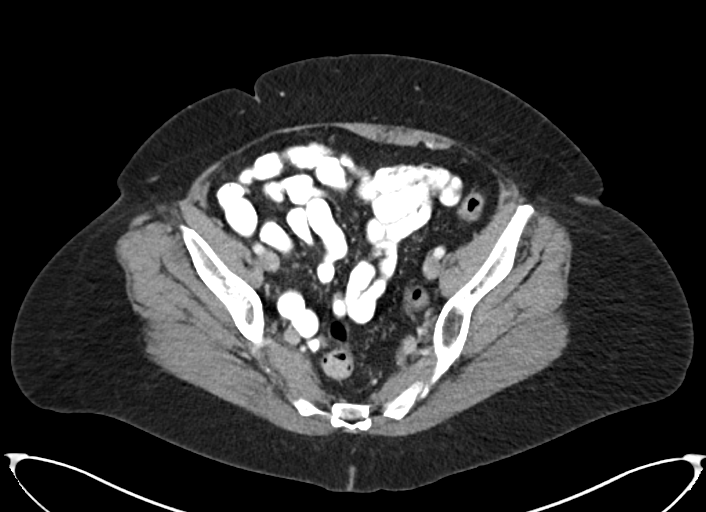
[im 39/88  soft-tissue]
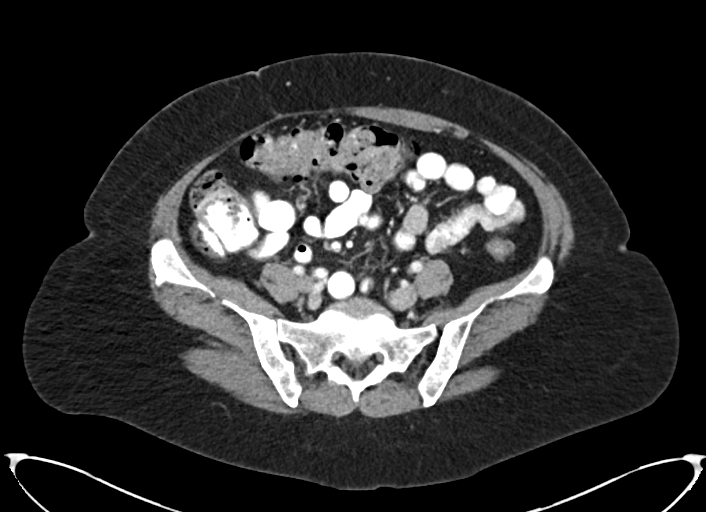
[im 49/88  soft-tissue]
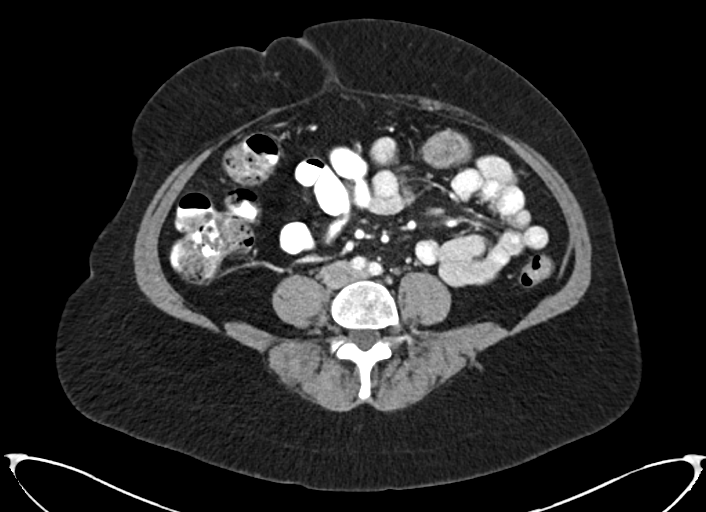
[im 59/88  soft-tissue]
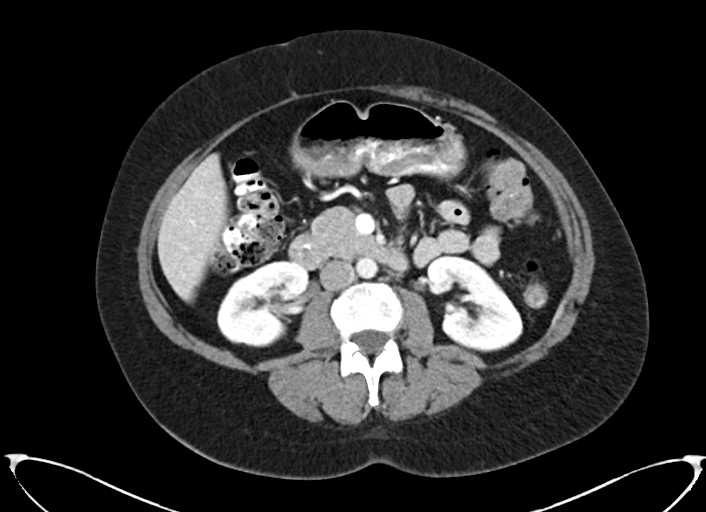
[im 63/88  soft-tissue]
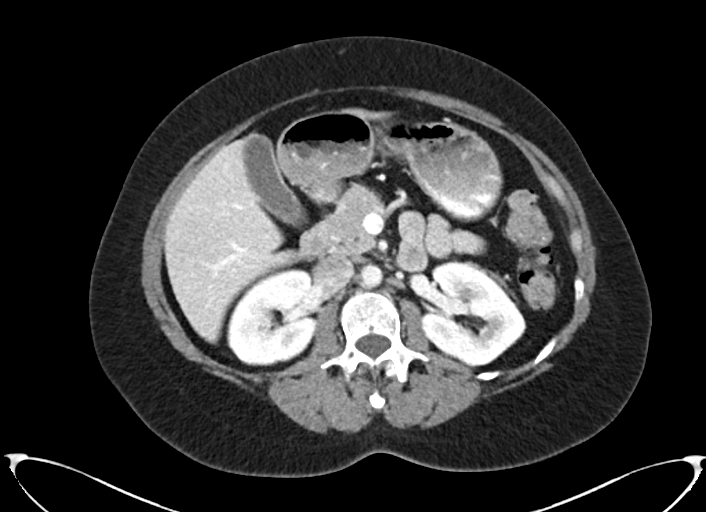
[im 73/88  soft-tissue]
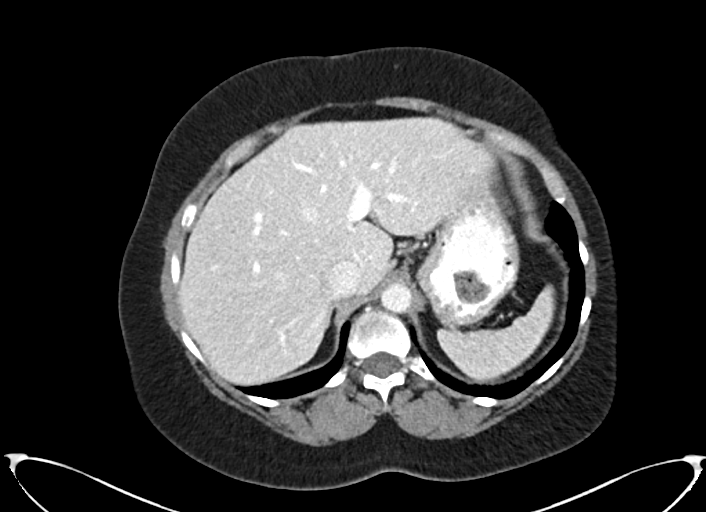
[im 73/88  bone]
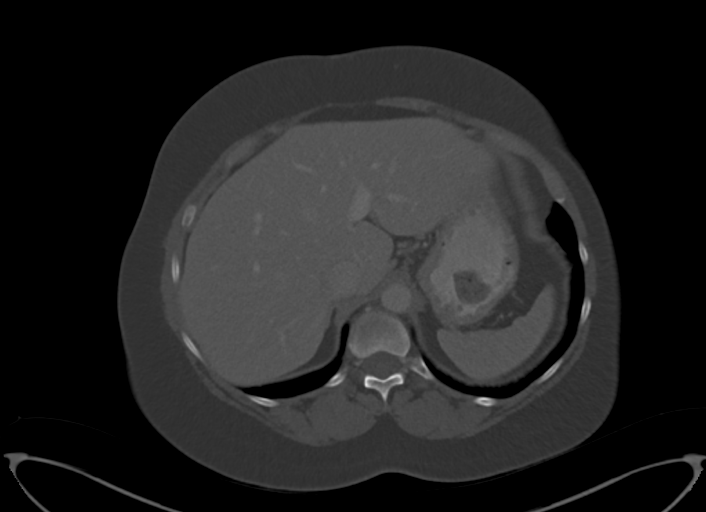
[im 83/88  soft-tissue]
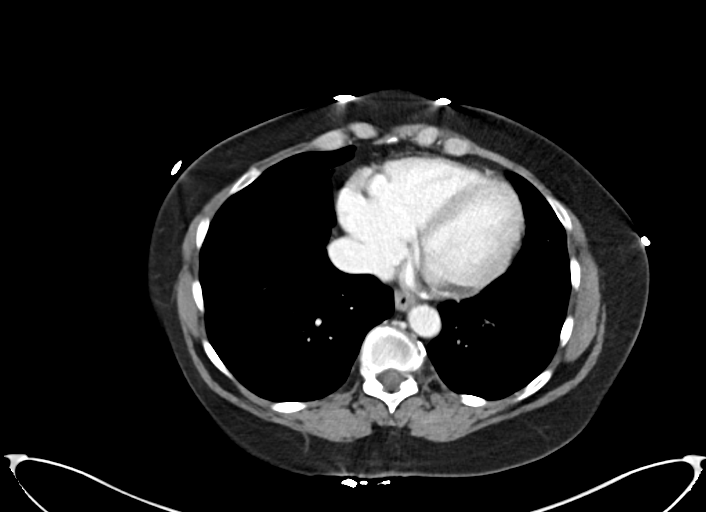

[Series 6: abd pelvis 2.00 br40 s3 cor · coronal · 0.87mm/px · 3 of 151 slices shown]
[im 51/151  soft-tissue]
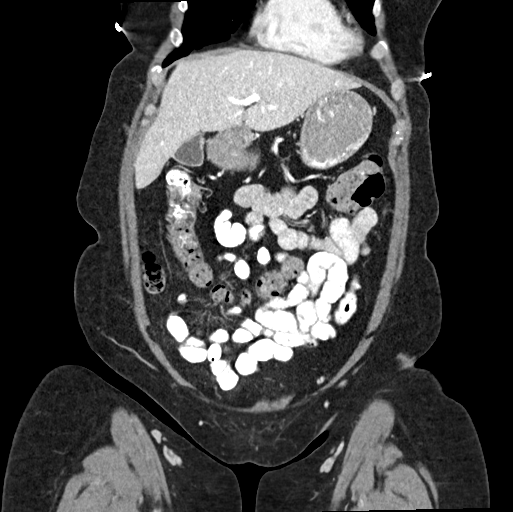
[im 67/151  soft-tissue]
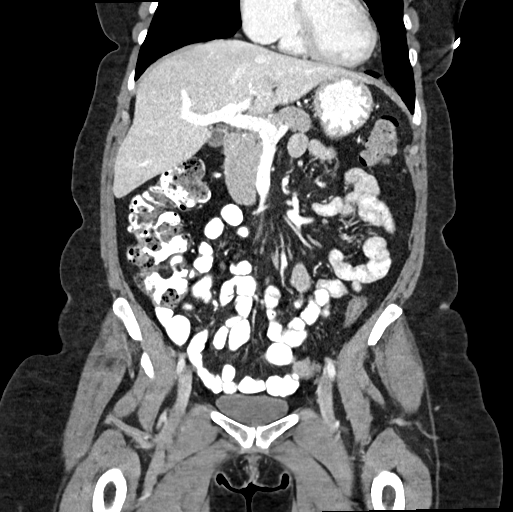
[im 84/151  soft-tissue]
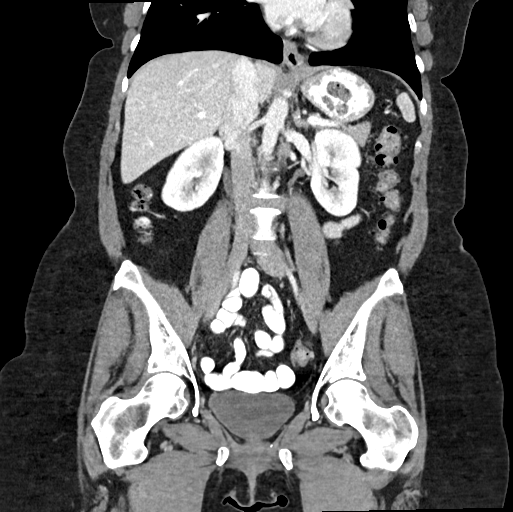

[13 of 46 positions shown; findings below may reference images not displayed]

FINDINGS: Lower chest: No acute abnormality.

Hepatobiliary: No solid liver abnormality is seen. No gallstones,
gallbladder wall thickening, or biliary dilatation.

Pancreas: Unremarkable. No pancreatic ductal dilatation or
surrounding inflammatory changes.

Spleen: Normal in size without significant abnormality.

Adrenals/Urinary Tract: Adrenal glands are unremarkable. Kidneys are
normal, without renal calculi, solid lesion, or hydronephrosis.
Bladder is unremarkable.

Stomach/Bowel: Stomach is within normal limits. Appendix appears
normal. No evidence of bowel wall thickening, distention, or
inflammatory changes. Moderate burden of stool in the colon.
Occasional sigmoid diverticula.

Vascular/Lymphatic: No significant vascular findings are present. No
enlarged abdominal or pelvic lymph nodes.

Reproductive: Status post hysterectomy.

Other: No abdominal wall hernia or abnormality. No abdominopelvic
ascites.

Musculoskeletal: No acute or significant osseous findings.
IMPRESSION: 1. No CT findings of the abdomen or pelvis to explain weight loss or
abdominal pain.

2.  Moderate burden of stool in the colon.

3.  Status post hysterectomy.

## 2020-06-11 ENCOUNTER — Telehealth: Payer: Medicare HMO | Admitting: Internal Medicine

## 2020-06-11 DIAGNOSIS — Z7689 Persons encountering health services in other specified circumstances: Secondary | ICD-10-CM

## 2020-08-06 ENCOUNTER — Encounter: Payer: Self-pay | Admitting: Internal Medicine

## 2020-08-06 ENCOUNTER — Telehealth (INDEPENDENT_AMBULATORY_CARE_PROVIDER_SITE_OTHER): Payer: Medicare HMO | Admitting: Internal Medicine

## 2020-08-06 VITALS — Ht 63.0 in | Wt 160.0 lb

## 2020-08-06 DIAGNOSIS — K219 Gastro-esophageal reflux disease without esophagitis: Secondary | ICD-10-CM | POA: Diagnosis not present

## 2020-08-06 DIAGNOSIS — F332 Major depressive disorder, recurrent severe without psychotic features: Secondary | ICD-10-CM

## 2020-08-06 DIAGNOSIS — Z7689 Persons encountering health services in other specified circumstances: Secondary | ICD-10-CM

## 2020-08-06 MED ORDER — PANTOPRAZOLE SODIUM 40 MG PO TBEC: 40.0000 mg | DELAYED_RELEASE_TABLET | Freq: Every day | ORAL | 3 refills | Status: DC

## 2020-08-06 MED ORDER — SERTRALINE HCL 25 MG PO TABS: ORAL_TABLET | ORAL | 1 refills | Status: DC

## 2020-08-06 NOTE — Progress Notes (Signed)
Establish care   Severe acid reflux- has had reflux since was younger, worsening recently

## 2020-08-06 NOTE — Progress Notes (Signed)
Virtual Visit via Telephone Note  I connected with MA MUNOZ, on 08/06/2020 at 3:36 PM by telephone due to the COVID-19 pandemic and verified that I am speaking with the correct person using two identifiers.   Consent: I discussed the limitations, risks, security and privacy concerns of performing an evaluation and management service by telephone and the availability of in person appointments. I also discussed with the patient that there may be a patient responsible charge related to this service. The patient expressed understanding and agreed to proceed.   Location of Patient: Home   Location of Provider: Clinic    Persons participating in Telemedicine visit: [name] [name] Dr. Juleen China    History of Present Illness: Patient has a visit to establish care. Has not had PCP. Has been followed by GYN that she has been seeing for years. Patient has a history of Lynch syndrome. Past surgical history of right tibial surgery and history of hysterectomy and oophorectomy.   Has concerns about her GERD. She has tried some OTC medications but doesn't remember the name of them. She has a lot of persistent reflux symptoms. This has been chronic and she hasn't addressed it.   Says that she stays very stressed and feels very down. Cries a lot. Unsure of why. Used to be on medication for depression years ago. Has trouble with falling asleep and being able to turn her brain off.    Depression screen [facility] 2/9 08/06/2020 08/06/2020  Decreased Interest 0 0  Down, Depressed, Hopeless 2 0  PHQ - 2 Score 2 0  Altered sleeping 3 -  Tired, decreased energy 3 -  Change in appetite 3 -  Feeling bad or failure about yourself  2 -  Trouble concentrating 3 -  Moving slowly or fidgety/restless 2 -  Suicidal thoughts 0 -  PHQ-9 Score 18 -  Difficult doing work/chores Very difficult -   Past Medical History:  Diagnosis Date  . Arthritis    right leg  . Depression    no meds currently  .  GERD (gastroesophageal reflux disease)   . Glaucoma   . MLH1-related Lynch syndrome (HNPCC2)   . Seizures ([place])    Only one back in 2004, none since, no meds, very stressed, patient just lost her husband  . SVD (spontaneous vaginal delivery)    x 4   No Known Allergies  No current outpatient medications on file prior to visit.   No current facility-administered medications on file prior to visit.    Observations/Objective: NAD. Speaking clearly.  Work of breathing normal.  Alert and oriented. Mood appropriate.   Assessment and Plan: 1. Encounter to establish care Reviewed patient's PMH, social history, surgical history, and medications.  Is overdue for annual exam, screening blood work, and health maintenance topics. Have asked patient to return for visit to address these items.   2. Severe episode of recurrent major depressive disorder, without psychotic features [facility]) Patient has PHQ-9 result concerning for moderate to severe depression. Will start Zoloft. Discussed how to take medication, potential side effects, length of trial to see efficacy. Will also have visit set up with Christa See, LCSW. Discussed benefit of dual approach to treating depression with medication and CBT. - sertraline (ZOLOFT) 25 MG tablet; Take one tablet daily. If tolerating, increase to two tablets in one week.  Dispense: 60 tablet; Refill: 1  3. Gastroesophageal reflux disease, unspecified whether esophagitis present Trial PPI. If no improvement after one month, will need  GI referral. Discussed potential dietary triggers and lifestyle modifications that can help control symptoms.  - pantoprazole (PROTONIX) 40 MG tablet; Take 1 tablet (40 mg total) by mouth daily.  Dispense: 30 tablet; Refill: 3   Follow Up Instructions: 6 week f/u depression and visit with Christa See, LCSW    I discussed the assessment and treatment plan with the patient. The patient was provided an opportunity to ask questions  and all were answered. The patient agreed with the plan and demonstrated an understanding of the instructions.   The patient was advised to call back or seek an in-person evaluation if the symptoms worsen or if the condition fails to improve as anticipated.     I provided 18 minutes total of non-face-to-face time during this encounter including median intraservice time, reviewing previous notes, investigations, ordering medications, medical decision making, coordinating care and patient verbalized understanding at the end of the visit.    Phill Myron, D.O. Primary Care at [facility]  08/06/2020, 3:36 PM

## 2020-08-07 ENCOUNTER — Institutional Professional Consult (permissible substitution): Payer: Medicare HMO | Admitting: Licensed Clinical Social Worker

## 2020-08-07 ENCOUNTER — Telehealth: Payer: Self-pay | Admitting: Licensed Clinical Social Worker

## 2020-08-07 NOTE — Telephone Encounter (Signed)
[facility] Intern called for scheduled appt with Pt but there was no answer. A message was left with a callback number for the Pt to call and either have the appt or reschedule.

## 2020-08-15 ENCOUNTER — Telehealth: Payer: Medicare HMO | Admitting: Internal Medicine

## 2020-08-18 ENCOUNTER — Other Ambulatory Visit: Payer: Self-pay

## 2020-08-18 ENCOUNTER — Other Ambulatory Visit: Payer: Medicare HMO

## 2020-08-18 ENCOUNTER — Ambulatory Visit (INDEPENDENT_AMBULATORY_CARE_PROVIDER_SITE_OTHER): Payer: Medicare HMO

## 2020-08-18 ENCOUNTER — Encounter: Payer: Self-pay | Admitting: Physician Assistant

## 2020-08-18 ENCOUNTER — Ambulatory Visit (INDEPENDENT_AMBULATORY_CARE_PROVIDER_SITE_OTHER): Payer: Medicare HMO | Admitting: Physician Assistant

## 2020-08-18 VITALS — BP 108/68 | HR 76 | Temp 97.2°F | Resp 18 | Ht 63.5 in | Wt 162.0 lb

## 2020-08-18 DIAGNOSIS — M79672 Pain in left foot: Secondary | ICD-10-CM

## 2020-08-18 DIAGNOSIS — Z1509 Genetic susceptibility to other malignant neoplasm: Secondary | ICD-10-CM | POA: Diagnosis not present

## 2020-08-18 DIAGNOSIS — Z13220 Encounter for screening for lipoid disorders: Secondary | ICD-10-CM

## 2020-08-18 DIAGNOSIS — Z114 Encounter for screening for human immunodeficiency virus [HIV]: Secondary | ICD-10-CM

## 2020-08-18 DIAGNOSIS — K219 Gastro-esophageal reflux disease without esophagitis: Secondary | ICD-10-CM

## 2020-08-18 DIAGNOSIS — F332 Major depressive disorder, recurrent severe without psychotic features: Secondary | ICD-10-CM | POA: Diagnosis not present

## 2020-08-18 DIAGNOSIS — F5104 Psychophysiologic insomnia: Secondary | ICD-10-CM

## 2020-08-18 DIAGNOSIS — Z1159 Encounter for screening for other viral diseases: Secondary | ICD-10-CM

## 2020-08-18 DIAGNOSIS — R200 Anesthesia of skin: Secondary | ICD-10-CM

## 2020-08-18 DIAGNOSIS — R202 Paresthesia of skin: Secondary | ICD-10-CM

## 2020-08-18 MED ORDER — HYDROXYZINE HCL 50 MG PO TABS: ORAL_TABLET | ORAL | 0 refills | Status: DC

## 2020-08-18 NOTE — Progress Notes (Signed)
Patient has not eaten or taken medication today. Patient denies pain at this time. Patient reports for the past few months she has had numbing in the right pinky radiating up the arm. Patient complains of over compensating in the left foot and experiencing tenderness in the heel. Patient requesting neurology and podiatry referral.

## 2020-08-18 NOTE — Progress Notes (Signed)
Established Patient Office Visit  Subjective:  Patient ID: [name], female    DOB: 09/02/1965  Age: 55 y.o. MRN: [id number]  CC:  Chief Complaint  Patient presents with  . Follow-up    HPI [name] presents with several complaints.  Reports that she left heel pain, right pinky numbness and is having difficulty sleeping.  Reports that she has been having left heel pain for "a long time".  Reports a history of right ankle and foot surgery which has caused her to have an abnormal gait.  Reports that she has tried rolling with a tennis ball, using supportive shoes without much relief.  Reports pain is worse in the morning.  Reports that she has been having right pinky numbness for the last few months.  Reports that she notices very pain in her elbow if she hits it on something.  Reports that she is right-handed.  Has not tried anything for relief.  Reports that she did start the Zoloft, is taking 25 mg at bedtime.  States it makes her feel a little sleepy the next morning.  Reports that she is having difficulty falling asleep and staying asleep, states that she is only sleeping approximately 3 hours.  Has previously failed melatonin.  Endorses good sleep hygiene.  Reports that she is taking the Protonix as directed and is already noticing relief from her acid reflux symptoms.   Past Medical History:  Diagnosis Date  . Anxiety    Phreesia 08/06/2020  . Arthritis    right leg  . Depression    no meds currently  . Depression    Phreesia 08/06/2020  . GERD (gastroesophageal reflux disease)   . Glaucoma   . MLH1-related Lynch syndrome (HNPCC2)   . Seizures ([place])    Only one back in 2004, none since, no meds, very stressed, patient just lost her husband  . SVD (spontaneous vaginal delivery)    x 4    Past Surgical History:  Procedure Laterality Date  . ANKLE SURGERY Right 2001   skin graft and bone replacment from right hip muscle fats from abdomen  . CO2 LASER  APPLICATION    . COLONOSCOPY WITH PROPOFOL N/A 02/09/2016   Procedure: COLONOSCOPY WITH PROPOFOL;  Surgeon: Garlan Fair, MD;  Location: [facility];  Service: Endoscopy;  Laterality: N/A;  . CYSTOSCOPY  03/17/2017   Procedure: CYSTOSCOPY;  Surgeon: Arvella Nigh, MD;  Location: [facility];  Service: Gynecology;;  . ESOPHAGOGASTRODUODENOSCOPY (EGD) WITH PROPOFOL N/A 02/09/2016   Procedure: ESOPHAGOGASTRODUODENOSCOPY (EGD) WITH PROPOFOL;  Surgeon: Garlan Fair, MD;  Location: [facility];  Service: Endoscopy;  Laterality: N/A;  . JOINT REPLACEMENT N/A    Phreesia 08/06/2020  . LAPAROSCOPIC VAGINAL HYSTERECTOMY WITH SALPINGO OOPHORECTOMY Bilateral 03/17/2017   Procedure: LAPAROSCOPIC ASSISTED VAGINAL HYSTERECTOMY WITH SALPINGO OOPHORECTOMY;  Surgeon: Arvella Nigh, MD;  Location: [place];  Service: Gynecology;  Laterality: Bilateral;  . right arm surgery     x 2   . TUBAL LIGATION    . WISDOM TOOTH EXTRACTION      Family History  Problem Relation Age of Onset  . Other Mother        hx of TAH-BSO in her late 20s-30 for unspecified cause  . Colon cancer Father        dx. early-mid-40s with later "recurrence"  . Ovarian cancer Sister 46  . Colon cancer Brother        dx. 52s; +surgery and chemotherapy  .  Colon cancer Paternal Aunt 59  . Colon cancer Paternal Uncle 48  . Cancer Maternal Grandmother        NOS cancer, d. 47s  . Breast cancer Paternal Grandmother        d. early 20s  . Colon polyps Other        "many"; dx. late 17s or younger  . Other Daughter        breast issues--infection and fluid had to be removed; also has issues with on-going periods; dx. 15 or younger  . Other Daughter        cyst on her fallopian tube, being monitored; dx. 49 or younger  . Other Brother        issues with recent illness; bloodwork to check for cancer - age 24  . Other Brother        issues with swollen prostate; GI issues; age 43  . Cancer Cousin 16        paternal 1st cousin dx. "bowel" cancer  . Colon cancer Cousin 47       paternal 1st cousin  . Colon cancer Cousin 60       paternal 1st cousin  . Colon cancer Cousin 78       paternal 1st cousin  . Colon cancer Cousin        paternal 1st cousin at unspecified age  . Colon cancer Cousin 45       daughter of affected paternal 1st cousin  . Uterine cancer Cousin        paternal 1st cousin, d. 88s  . Schizophrenia Son   . Drug abuse Son   . Cancer Brother        paternal half-brother dx. in his 67s; NOS cancer  . Prostate cancer Brother        paternal half-brother d. 82s    Social History   Socioeconomic History  . Marital status: Married    Spouse name: Not on file  . Number of children: Not on file  . Years of education: Not on file  . Highest education level: Not on file  Occupational History  . Not on file  Tobacco Use  . Smoking status: Never Smoker  . Smokeless tobacco: Never Used  Vaping Use  . Vaping Use: Never used  Substance and Sexual Activity  . Alcohol use: No    Comment: hx of EtOH abuse; no alcohol now  . Drug use: No  . Sexual activity: Not on file  Other Topics Concern  . Not on file  Social History Narrative  . Not on file   Social Determinants of Health   Financial Resource Strain: Not on file  Food Insecurity: Not on file  Transportation Needs: Not on file  Physical Activity: Not on file  Stress: Not on file  Social Connections: Not on file  Intimate Partner Violence: Not on file    Outpatient Medications Prior to Visit  Medication Sig Dispense Refill  . pantoprazole (PROTONIX) 40 MG tablet Take 1 tablet (40 mg total) by mouth daily. 30 tablet 3  . sertraline (ZOLOFT) 25 MG tablet Take one tablet daily. If tolerating, increase to two tablets in one week. 60 tablet 1  . azelastine (OPTIVAR) 0.05 % ophthalmic solution Apply 1 drop to eye 2 (two) times daily.     No facility-administered medications prior to visit.    No Known  Allergies  ROS Review of Systems  Constitutional: Positive for fatigue. Negative for chills and fever.  HENT: Negative.   Eyes: Negative.   Respiratory: Negative.   Cardiovascular: Negative.   Gastrointestinal: Negative.   Endocrine: Negative.   Genitourinary: Negative.   Musculoskeletal: Positive for arthralgias, gait problem and myalgias.  Skin: Negative.   Allergic/Immunologic: Negative.   Hematological: Negative.   Psychiatric/Behavioral: Positive for dysphoric mood and sleep disturbance. Negative for self-injury and suicidal ideas. The patient is nervous/anxious.       Objective:    Physical Exam Vitals and nursing note reviewed.  Constitutional:      Appearance: Normal appearance.  HENT:     Head: Normocephalic and atraumatic.     Right Ear: External ear normal.     Left Ear: External ear normal.     Nose: Nose normal.     Mouth/Throat:     Mouth: Mucous membranes are moist.     Pharynx: Oropharynx is clear.  Cardiovascular:     Rate and Rhythm: Normal rate and regular rhythm.     Pulses: Normal pulses.          Dorsalis pedis pulses are 2+ on the left side.       Posterior tibial pulses are 2+ on the left side.     Heart sounds: Normal heart sounds.  Pulmonary:     Effort: Pulmonary effort is normal.     Breath sounds: Normal breath sounds.  Musculoskeletal:     Cervical back: Normal range of motion and neck supple.     Left foot: Decreased range of motion.  Feet:     Left foot:     Skin integrity: Skin integrity normal.     Comments: Heavy scarring noted right ankle/ fot Skin:    General: Skin is warm and dry.  Neurological:     General: No focal deficit present.     Mental Status: She is alert and oriented to person, place, and time. Mental status is at baseline.  Psychiatric:        Mood and Affect: Mood normal.        Behavior: Behavior normal.        Thought Content: Thought content normal.        Judgment: Judgment normal.     BP 108/68 (BP  Location: Right Arm, Patient Position: Sitting, Cuff Size: Normal)   Pulse 76   Temp (!) 97.2 F (36.2 C) (Oral)   Resp 18   Ht 5' 3.5" (1.613 m)   Wt 162 lb (73.5 kg)   LMP 12/29/2016   SpO2 97%   BMI 28.25 kg/m  Wt Readings from Last 3 Encounters:  08/18/20 162 lb (73.5 kg)  08/06/20 160 lb (72.6 kg)  01/07/19 186 lb 4.6 oz (84.5 kg)     Health Maintenance Due  Topic Date Due  . MAMMOGRAM  01/05/2018    There are no preventive care reminders to display for this patient.  Lab Results  Component Value Date   TSH 2.642 06/19/2016   Lab Results  Component Value Date   WBC 10.3 03/18/2017   HGB 11.4 (L) 03/18/2017   HCT 34.9 (L) 03/18/2017   MCV 87.0 03/18/2017   PLT 253 03/18/2017   Lab Results  Component Value Date   NA 139 03/15/2017   K 4.8 03/15/2017   CO2 28 03/15/2017   GLUCOSE 108 (H) 03/15/2017   BUN 9 03/15/2017   CREATININE 0.65 03/15/2017   BILITOT 0.6 03/15/2017   ALKPHOS 72 03/15/2017   AST 20 03/15/2017   ALT 10 (L) 03/15/2017  PROT 8.3 (H) 03/15/2017   ALBUMIN 4.4 03/15/2017   CALCIUM 9.8 03/15/2017   ANIONGAP 6 03/15/2017   Lab Results  Component Value Date   CHOL 183 06/19/2016   Lab Results  Component Value Date   HDL 111 06/19/2016   Lab Results  Component Value Date   LDLCALC 60 06/19/2016   Lab Results  Component Value Date   TRIG 62 06/19/2016   Lab Results  Component Value Date   CHOLHDL 1.6 06/19/2016   Lab Results  Component Value Date   HGBA1C 4.8 06/19/2016      Assessment & Plan:   Problem List Items Addressed This Visit      Digestive   Gastroesophageal reflux disease     Other   MLH1-related Lynch syndrome (HNPCC2)   Severe episode of recurrent major depressive disorder, without psychotic features (HCC)   Relevant Medications   hydrOXYzine (ATARAX/VISTARIL) 50 MG tablet   Other Relevant Orders   Vitamin D 1,25 dihydroxy   CBC with Differential/Platelet   Pain of left heel - Primary   Relevant  Orders   Ambulatory referral to Podiatry   DG Foot Complete Left (Completed)    Other Visit Diagnoses    Numbness and tingling in right hand       Relevant Orders   Comp. Metabolic Panel (12)   Psychophysiological insomnia       Relevant Medications   hydrOXYzine (ATARAX/VISTARIL) 50 MG tablet   Other Relevant Orders   TSH   Screening, lipid       Relevant Orders   Lipid panel   Screening for HIV without presence of risk factors       Relevant Orders   HIV antibody (with reflex)   Encounter for HCV screening test for low risk patient       Relevant Orders   HCV Ab w/Rflx to Verification      Meds ordered this encounter  Medications  . hydrOXYzine (ATARAX/VISTARIL) 50 MG tablet    Sig: Take 1/2 - 1 full tab PO QHS PRN for insomnia    Dispense:  30 tablet    Refill:  0    Order Specific Question:   Supervising Provider    Answer:   Joya Gaskins, PATRICK E [[id number]]  1. Pain of left heel Patient education given, recommend well supportive shoes, ice as tolerated - Ambulatory referral to [place] Complete Left  2. Severe episode of recurrent major depressive disorder, without psychotic features (HCC) Increase Zoloft 50 mg - Vitamin D 1,25 dihydroxy - CBC with Differential/Platelet  3. Gastroesophageal reflux disease, unspecified whether esophagitis present   4. MLH1-related Lynch syndrome (HNPCC2)   5. Numbness and tingling in right hand Patient education given, bracing recommended - Comp. Metabolic Panel (12)  6. Psychophysiological insomnia Trial hydroxyzine - hydrOXYzine (ATARAX/VISTARIL) 50 MG tablet; Take 1/2 - 1 full tab PO QHS PRN for insomnia  Dispense: 30 tablet; Refill: 0 - TSH  7. Screening, lipid Fasting labs completed today - Lipid panel  8. Screening for HIV without presence of risk factors  - HIV antibody (with reflex)  9. Encounter for HCV screening test for low risk patient  - HCV Ab w/Rflx to Verification  Follow-up: Return in about  4 weeks (around 09/15/2020).    I have reviewed the patient's medical history (PMH, PSH, Social History, Family History, Medications, and allergies) , and have been updated if relevant. I spent 30 minutes reviewing chart and  face to face time  with patient.     Loraine Grip Mayers, PA-C

## 2020-08-18 NOTE — Patient Instructions (Addendum)
For your depression and anxiety, go ahead and increase Zoloft to 50 mg at bedtime.  For your insomnia, you will take 25 mg to 50 mg of hydroxyzine at bedtime.  I would start with 25 mg and if that is not effective go ahead and take 50 mg.  For your heel pain, I recommend wearing well supportive shoes, using ice as tolerated, we will call you with your x-ray results and I started a referral for you to be seen by podiatry.  For your right pinky numbness, I encourage you to purchase the brace we spoke about, and wear that during the day and at bedtime if you are able.  We will call you with your lab results.  I hope that you feel better soon, please let us know if there is anything else we can do for you  Beth Rad, PA-C Physician Assistant [facility] [email or web address]   Plantar Fasciitis  Plantar fasciitis is a painful foot condition that affects the heel. It occurs when the band of tissue that connects the toes to the heel bone (plantar fascia) becomes irritated. This can happen as the result of exercising too much or doing other repetitive activities (overuse injury). Plantar fasciitis can cause mild irritation to severe pain that makes it difficult to walk or move. The pain is usually worse in the morning after sleeping, or after sitting or lying down for a period of time. Pain may also be worse after long periods of walking or standing. What are the causes? This condition may be caused by:  Standing for long periods of time.  Wearing shoes that do not have good arch support.  Doing activities that put stress on joints (high-impact activities). This includes ballet and exercise that makes your heart beat faster (aerobic exercise), such as running.  Being overweight.  An abnormal way of walking (gait).  Tight muscles in the back of your lower leg (calf).  High arches in your feet or flat feet.  Starting a new athletic  activity. What are the signs or symptoms? The main symptom of this condition is heel pain. Pain may get worse after the following:  Taking the first steps after a time of rest, especially in the morning after awakening, or after you have been sitting or lying down for a while.  Long periods of standing still. Pain may decrease after 30-45 minutes of activity, such as gentle walking. How is this diagnosed? This condition may be diagnosed based on your medical history, a physical exam, and your symptoms. Your health care provider will check for:  A tender area on the bottom of your foot.  A high arch in your foot or flat feet.  Pain when you move your foot.  Difficulty moving your foot. You may have imaging tests to confirm the diagnosis, such as:  X-rays.  Ultrasound.  MRI. How is this treated? Treatment for plantar fasciitis depends on how severe your condition is. Treatment may include:  Rest, ice, pressure (compression), and raising (elevating) the affected foot. This is called RICE therapy. Your health care provider may recommend RICE therapy along with over-the-counter pain medicines to manage your pain.  Exercises to stretch your calves and your plantar fascia.  A splint that holds your foot in a stretched, upward position while you sleep (night splint).  Physical therapy to relieve symptoms and prevent problems in the future.  Injections of steroid medicine (cortisone) to relieve pain and inflammation.  Stimulating your plantar fascia with  electrical impulses (extracorporeal shock wave therapy). This is usually the last treatment option before surgery.  Surgery, if other treatments have not worked after 12 months. Follow these instructions at home: Managing pain, stiffness, and swelling  If directed, put ice on the painful area. To do this: ? Put ice in a plastic bag, or use a frozen bottle of water. ? Place a towel between your skin and the bag or bottle. ? Roll  the bottom of your foot over the bag or bottle. ? Do this for 20 minutes, 2-3 times a day.  Wear athletic shoes that have air-sole or gel-sole cushions, or try soft shoe inserts that are designed for plantar fasciitis.  Elevate your foot above the level of your heart while you are sitting or lying down.   Activity  Avoid activities that cause pain. Ask your health care provider what activities are safe for you.  Do physical therapy exercises and stretches as told by your health care provider.  Try activities and forms of exercise that are easier on your joints (low impact). Examples include swimming, water aerobics, and biking. General instructions  Take over-the-counter and prescription medicines only as told by your health care provider.  Wear a night splint while sleeping, if told by your health care provider. Loosen the splint if your toes tingle, become numb, or turn cold and blue.  Maintain a healthy weight, or work with your health care provider to lose weight as needed.  Keep all follow-up visits. This is important. Contact a health care provider if you have:  Symptoms that do not go away with home treatment.  Pain that gets worse.  Pain that affects your ability to move or do daily activities. Summary  Plantar fasciitis is a painful foot condition that affects the heel. It occurs when the band of tissue that connects the toes to the heel bone (plantar fascia) becomes irritated.  Heel pain is the main symptom of this condition. It may get worse after exercising too much or standing still for a long time.  Treatment varies, but it usually starts with rest, ice, pressure (compression), and raising (elevating) the affected foot. This is called RICE therapy. Over-the-counter medicines can also be used to manage pain. This information is not intended to replace advice given to you by your health care provider. Make sure you discuss any questions you have with your health care  provider. Document Revised: 10/22/2019 Document Reviewed: 10/22/2019 Elsevier Patient Education  2021 [place].   Tendinitis  Tendinitis is inflammation of a tendon. A tendon is a strong cord of tissue that connects muscle to bone. Tendinitis can affect any tendon, but it most commonly affects the:  Shoulder tendon (rotator cuff).  Ankle tendon (Achilles tendon).  Elbow tendon (triceps tendon).  Tendons in the wrist. What are the causes? This condition may be caused by:  Overusing a tendon or muscle. This is common.  Age-related wear and tear.  Injury.  Inflammatory conditions, such as arthritis.  Certain medicines. What increases the risk? You are more likely to develop this condition if you do activities that involve the same movements over and over again (repetitive motions). What are the signs or symptoms? Symptoms of this condition may include:  Pain.  Tenderness.  Mild swelling.  Decreased range of motion. How is this diagnosed? This condition is diagnosed with a physical exam. You may also have tests, such as:  Ultrasound. This uses sound waves to make an image of the inside of  your body in the affected area.  MRI. How is this treated? This condition may be treated by resting, icing, applying pressure (compression), and raising (elevating) the affected area above the level of your heart. This is known as RICE therapy. Treatment may also include:  Medicines to help reduce inflammation or to help reduce pain.  Exercises or physical therapy to strengthen and stretch the tendon.  A brace or splint.  Surgery. This is rarely needed. Follow these instructions at home: If you have a splint or brace:  Wear the splint or brace as told by your health care provider. Remove it only as told by your health care provider.  Loosen the splint or brace if your fingers or toes tingle, become numb, or turn cold and blue.  Keep the splint or brace clean.  If  the splint or brace is not waterproof: ? Do not let it get wet. ? Cover it with a watertight covering when you take a bath or shower. Managing pain, stiffness, and swelling  If directed, put ice on the affected area. ? If you have a removable splint or brace, remove it as told by your health care provider. ? Put ice in a plastic bag. ? Place a towel between your skin and the bag. ? Leave the ice on for 20 minutes, 2-3 times a day.  Move the fingers or toes of the affected limb often, if this applies. This can help to prevent stiffness and lessen swelling.  If directed, raise (elevate) the affected area above the level of your heart while you are sitting or lying down.   If directed, apply heat to the affected area before you exercise. Use the heat source that your health care provider recommends, such as a moist heat pack or a heating pad. ? Place a towel between your skin and the heat source. ? Leave the heat on for 20-30 minutes. ? Remove the heat if your skin turns bright red. This is especially important if you are unable to feel pain, heat, or cold. You may have a greater risk of getting burned.      Driving  Do not drive or use heavy machinery while taking prescription pain medicine.  Ask your health care provider when it is safe to drive if you have a splint or brace on any part of your arm or leg. Activity  Rest the affected area as told by your health care provider.  Return to your normal activities as told by your health care provider. Ask your health care provider what activities are safe for you.  Avoid using the affected area while you are experiencing symptoms of tendinitis.  Do exercises as told by your health care provider. General instructions  If you have a splint, do not put pressure on any part of the splint until it is fully hardened. This may take several hours.  Wear an elastic bandage or compression wrap only as told by your health care  provider.  Take over-the-counter and prescription medicines only as told by your health care provider.  Keep all follow-up visits as told by your health care provider. This is important. Contact a health care provider if:  Your symptoms do not improve.  You develop new, unexplained problems, such as numbness in your hands. Summary  Tendinitis is inflammation of a tendon.  You are more likely to develop this condition if you do activities that involve the same movements over and over again.  This condition may be treated  by resting, icing, applying pressure (compression), and elevating the area above the level of your heart. This is known as RICE therapy.  Avoid using the affected area while you are experiencing symptoms of tendinitis. This information is not intended to replace advice given to you by your health care provider. Make sure you discuss any questions you have with your health care provider. Document Revised: 01/10/2018 Document Reviewed: 11/23/2017 Elsevier Patient Education  [place].

## 2020-08-19 DIAGNOSIS — R200 Anesthesia of skin: Secondary | ICD-10-CM | POA: Insufficient documentation

## 2020-08-19 DIAGNOSIS — F5104 Psychophysiologic insomnia: Secondary | ICD-10-CM | POA: Insufficient documentation

## 2020-08-21 ENCOUNTER — Telehealth: Payer: Self-pay | Admitting: *Deleted

## 2020-08-21 NOTE — Telephone Encounter (Signed)
Medical Assistant left message on patient's home and cell voicemail. Voicemail states to give a call back to [place] with [facility] at [phone]. Patient has also viewed results via mychart.

## 2020-08-21 NOTE — Telephone Encounter (Signed)
-----   Message from Kennieth Rad, [place] sent at 08/19/2020  4:43 PM EST ----- Please call patient and let her know that her foot x-ray did show a bone spur, she will continue to follow-up with podiatry for further evaluation.  Still awaiting other lab results.

## 2020-08-21 NOTE — Telephone Encounter (Signed)
Patient verified DOB Patient is aware of bone spur being noted on xray and has appointment with podiatry on tomorrow. Patient is aware of [facility] calling with blood work results on Monday once the send out vitamin d level returns.

## 2020-08-22 ENCOUNTER — Ambulatory Visit: Payer: Medicare HMO | Admitting: Podiatry

## 2020-08-22 LAB — CBC WITH DIFFERENTIAL/PLATELET
Basophils Absolute: 0.1 10*3/uL (ref 0.0–0.2)
Basos: 1 %
EOS (ABSOLUTE): 0.1 10*3/uL (ref 0.0–0.4)
Eos: 2 %
Hematocrit: 39.8 % (ref 34.0–46.6)
Hemoglobin: 12.8 g/dL (ref 11.1–15.9)
Immature Grans (Abs): 0 10*3/uL (ref 0.0–0.1)
Immature Granulocytes: 0 %
Lymphocytes Absolute: 1.6 10*3/uL (ref 0.7–3.1)
Lymphs: 28 %
MCH: 29.2 pg (ref 26.6–33.0)
MCHC: 32.2 g/dL (ref 31.5–35.7)
MCV: 91 fL (ref 79–97)
Monocytes Absolute: 0.4 10*3/uL (ref 0.1–0.9)
Monocytes: 7 %
Neutrophils Absolute: 3.5 10*3/uL (ref 1.4–7.0)
Neutrophils: 62 %
Platelets: 301 10*3/uL (ref 150–450)
RBC: 4.39 x10E6/uL (ref 3.77–5.28)
RDW: 12.7 % (ref 11.7–15.4)
WBC: 5.7 10*3/uL (ref 3.4–10.8)

## 2020-08-22 LAB — LIPID PANEL
Chol/HDL Ratio: 3.1 ratio (ref 0.0–4.4)
Cholesterol, Total: 156 mg/dL (ref 100–199)
HDL: 50 mg/dL (ref >39)
LDL Chol Calc (NIH): 95 mg/dL (ref 0–99)
Triglycerides: 50 mg/dL (ref 0–149)
VLDL Cholesterol Cal: 11 mg/dL (ref 5–40)

## 2020-08-22 LAB — COMP. METABOLIC PANEL (12)
AST: 15 IU/L (ref 0–40)
Albumin/Globulin Ratio: 1.5 (ref 1.2–2.2)
Albumin: 4.6 g/dL (ref 3.8–4.9)
Alkaline Phosphatase: 95 IU/L (ref 44–121)
BUN/Creatinine Ratio: 16 (ref 9–23)
BUN: 10 mg/dL (ref 6–24)
Bilirubin Total: 0.3 mg/dL (ref 0.0–1.2)
Calcium: 9.9 mg/dL (ref 8.7–10.2)
Chloride: 102 mmol/L (ref 96–106)
Creatinine, Ser: 0.61 mg/dL (ref 0.57–1.00)
GFR calc Af Amer: 119 mL/min/{1.73_m2} (ref >59)
GFR calc non Af Amer: 103 mL/min/{1.73_m2} (ref >59)
Globulin, Total: 3.1 g/dL (ref 1.5–4.5)
Glucose: 90 mg/dL (ref 65–99)
Potassium: 4.5 mmol/L (ref 3.5–5.2)
Sodium: 141 mmol/L (ref 134–144)
Total Protein: 7.7 g/dL (ref 6.0–8.5)

## 2020-08-22 LAB — VITAMIN D 1,25 DIHYDROXY
Vitamin D 1, 25 (OH)2 Total: 43 pg/mL
Vitamin D2 1, 25 (OH)2: <10 pg/mL
Vitamin D3 1, 25 (OH)2: 43 pg/mL

## 2020-08-22 LAB — HIV ANTIBODY (ROUTINE TESTING W REFLEX): HIV Screen 4th Generation wRfx: NONREACTIVE

## 2020-08-22 LAB — TSH: TSH: 1.79 u[IU]/mL (ref 0.450–4.500)

## 2020-08-22 LAB — HCV INTERPRETATION

## 2020-08-22 LAB — HCV AB W/RFLX TO VERIFICATION: HCV Ab: <0.1 s/co ratio (ref 0.0–0.9)

## 2020-08-25 ENCOUNTER — Telehealth: Payer: Self-pay | Admitting: *Deleted

## 2020-08-25 NOTE — Telephone Encounter (Signed)
Patient verified DOB Patient is aware of all labs being normal and negative. Patient is aware of keeping PCP appointment on 2/28 and requesting a FU mammogram from a 55 year old scan.

## 2020-08-25 NOTE — Telephone Encounter (Signed)
-----   Message from Kennieth Rad, [place] sent at 08/23/2020  4:08 PM EST ----- Please let patient know that her thyroid, kidney and liver function are within normal limits.  She does not show signs of anemia.  Her cholesterol is within normal limits.  Her screening for hepatitis C and HIV were negative

## 2020-08-26 ENCOUNTER — Ambulatory Visit: Payer: Medicare HMO | Admitting: Podiatry

## 2020-08-28 ENCOUNTER — Other Ambulatory Visit: Payer: Self-pay | Admitting: Internal Medicine

## 2020-08-28 DIAGNOSIS — F332 Major depressive disorder, recurrent severe without psychotic features: Secondary | ICD-10-CM

## 2020-08-29 ENCOUNTER — Ambulatory Visit (INDEPENDENT_AMBULATORY_CARE_PROVIDER_SITE_OTHER): Payer: Medicare HMO | Admitting: Podiatry

## 2020-08-29 DIAGNOSIS — Z5329 Procedure and treatment not carried out because of patient's decision for other reasons: Secondary | ICD-10-CM

## 2020-09-05 NOTE — Progress Notes (Signed)
New patient no show for appointment. No show fee generated.

## 2020-09-10 ENCOUNTER — Other Ambulatory Visit: Payer: Self-pay | Admitting: Physician Assistant

## 2020-09-10 DIAGNOSIS — F5104 Psychophysiologic insomnia: Secondary | ICD-10-CM

## 2020-09-15 ENCOUNTER — Encounter: Payer: Medicare HMO | Admitting: Family

## 2020-09-15 NOTE — Progress Notes (Signed)
Patient did not show for appointment.   

## 2020-09-29 ENCOUNTER — Other Ambulatory Visit: Payer: Self-pay | Admitting: Physician Assistant

## 2020-09-29 DIAGNOSIS — F5104 Psychophysiologic insomnia: Secondary | ICD-10-CM

## 2020-11-04 ENCOUNTER — Other Ambulatory Visit: Payer: Self-pay

## 2020-11-04 ENCOUNTER — Ambulatory Visit
Admission: EM | Admit: 2020-11-04 | Discharge: 2020-11-04 | Disposition: A | Discharge status: Home / Self Care | Payer: Medicare HMO | Attending: Family Medicine | Admitting: Family Medicine

## 2020-11-04 ENCOUNTER — Encounter: Payer: Self-pay | Admitting: Emergency Medicine

## 2020-11-04 DIAGNOSIS — J019 Acute sinusitis, unspecified: Secondary | ICD-10-CM | POA: Diagnosis not present

## 2020-11-04 DIAGNOSIS — H6121 Impacted cerumen, right ear: Secondary | ICD-10-CM

## 2020-11-04 MED ORDER — CEFDINIR 300 MG PO CAPS: 300.0000 mg | ORAL_CAPSULE | Freq: Two times a day (BID) | ORAL | 0 refills | Status: AC

## 2020-11-04 NOTE — ED Provider Notes (Signed)
[name]    CSN: [id number] Arrival date & time: 11/04/20  1110      History   Chief Complaint Chief Complaint  Patient presents with  . Otalgia  . Facial Pain  . Headache  . Nasal Congestion    HPI [name] is a 55 y.o. female.   HPI Patient presents with concern for possible sinus infection. Onset >5 days. This is an occasional  recurrent problem that patient reports occurring at least 1-2 times per year. Endorses right acial pressure, frontal headache, right ear pressure and diminished hearing, cDenies fever, ST, chest tightness, eye irritation,  or GI symptoms. Attempted relief with OTC medication without any relief of symptoms. Patient is a nonsmoker. Severity: moderate    Remainder of Review of Systems negative except as noted in the HPI.  Past Medical History:  Diagnosis Date  . Anxiety    Phreesia 08/06/2020  . Arthritis    right leg  . Depression    no meds currently  . Depression    Phreesia 08/06/2020  . GERD (gastroesophageal reflux disease)   . Glaucoma   . MLH1-related Lynch syndrome (HNPCC2)   . Seizures ([place])    Only one back in 2004, none since, no meds, very stressed, patient just lost her husband  . SVD (spontaneous vaginal delivery)    x 4    Patient Active Problem List   Diagnosis Date Noted  . Numbness and tingling in right hand 08/19/2020  . Psychophysiological insomnia 08/19/2020  . Gastroesophageal reflux disease 08/18/2020  . Severe episode of recurrent major depressive disorder, without psychotic features ([place]) 08/18/2020  . Pain of left heel 08/18/2020  . Alcohol use disorder, moderate, dependence ([place]) 06/18/2016  . MDD (major depressive disorder), single episode, severe with psychotic features ([place]) 06/17/2016  . Genetic testing 03/28/2016  . MLH1-related Lynch syndrome (HNPCC2) 03/28/2016  . Family history of colon cancer 10/16/2015  . Family history of ovarian cancer 10/16/2015  . Family history of  breast cancer in female 10/16/2015  . History of colonic polyps 10/16/2015    Past Surgical History:  Procedure Laterality Date  . ANKLE SURGERY Right 2001   skin graft and bone replacment from right hip muscle fats from abdomen  . CO2 LASER APPLICATION    . COLONOSCOPY WITH PROPOFOL N/A 02/09/2016   Procedure: COLONOSCOPY WITH PROPOFOL;  Surgeon: Garlan Fair, MD;  Location: [facility];  Service: Endoscopy;  Laterality: N/A;  . CYSTOSCOPY  03/17/2017   Procedure: CYSTOSCOPY;  Surgeon: Arvella Nigh, MD;  Location: [facility];  Service: Gynecology;;  . ESOPHAGOGASTRODUODENOSCOPY (EGD) WITH PROPOFOL N/A 02/09/2016   Procedure: ESOPHAGOGASTRODUODENOSCOPY (EGD) WITH PROPOFOL;  Surgeon: Garlan Fair, MD;  Location: [facility];  Service: Endoscopy;  Laterality: N/A;  . JOINT REPLACEMENT N/A    Phreesia 08/06/2020  . LAPAROSCOPIC VAGINAL HYSTERECTOMY WITH SALPINGO OOPHORECTOMY Bilateral 03/17/2017   Procedure: LAPAROSCOPIC ASSISTED VAGINAL HYSTERECTOMY WITH SALPINGO OOPHORECTOMY;  Surgeon: Arvella Nigh, MD;  Location: [facility];  Service: Gynecology;  Laterality: Bilateral;  . right arm surgery     x 2   . TUBAL LIGATION    . WISDOM TOOTH EXTRACTION      OB History   No obstetric history on file.      Home Medications    Prior to Admission medications   Medication Sig Start Date End Date Taking? Authorizing Provider  cefdinir (OMNICEF) 300 MG capsule Take 1 capsule (300 mg total) by mouth  2 (two) times daily for 5 days. 11/04/20 11/09/20 Yes Scot Jun, FNP  azelastine (OPTIVAR) 0.05 % ophthalmic solution Apply 1 drop to eye 2 (two) times daily. 04/28/20   [provider]  hydrOXYzine (ATARAX/VISTARIL) 50 MG tablet Take 1/2 - 1 full tab PO QHS PRN for insomnia 08/18/20   Mayers, Cari S, PA-C  pantoprazole (PROTONIX) 40 MG tablet Take 1 tablet (40 mg total) by mouth daily. 08/06/20   Nicolette Bang, DO  sertraline  (ZOLOFT) 25 MG tablet TAKE ONE TABLET DAILY. IF TOLERATING, INCREASE TO TWO TABLETS IN ONE WEEK. 08/28/20   Nicolette Bang, DO    Family History Family History  Problem Relation Age of Onset  . Other Mother        hx of TAH-BSO in her late 20s-30 for unspecified cause  . Colon cancer Father        dx. early-mid-40s with later "recurrence"  . Ovarian cancer Sister 65  . Colon cancer Brother        dx. 64s; +surgery and chemotherapy  . Colon cancer Paternal Aunt 39  . Colon cancer Paternal Uncle 88  . Cancer Maternal Grandmother        NOS cancer, d. 35s  . Breast cancer Paternal Grandmother        d. early 41s  . Colon polyps Other        "many"; dx. late 3s or younger  . Other Daughter        breast issues--infection and fluid had to be removed; also has issues with on-going periods; dx. 15 or younger  . Other Daughter        cyst on her fallopian tube, being monitored; dx. 63 or younger  . Other Brother        issues with recent illness; bloodwork to check for cancer - age 64  . Other Brother        issues with swollen prostate; GI issues; age 50  . Cancer Cousin 67       paternal 1st cousin dx. "bowel" cancer  . Colon cancer Cousin 75       paternal 1st cousin  . Colon cancer Cousin 52       paternal 1st cousin  . Colon cancer Cousin 66       paternal 1st cousin  . Colon cancer Cousin        paternal 1st cousin at unspecified age  . Colon cancer Cousin 84       daughter of affected paternal 1st cousin  . Uterine cancer Cousin        paternal 1st cousin, d. 78s  . Schizophrenia Son   . Drug abuse Son   . Cancer Brother        paternal half-brother dx. in his 58s; NOS cancer  . Prostate cancer Brother        paternal half-brother d. 62s    Social History Social History   Tobacco Use  . Smoking status: Never Smoker  . Smokeless tobacco: Never Used  Vaping Use  . Vaping Use: Never used  Substance Use Topics  . Alcohol use: No    Comment: hx of  EtOH abuse; no alcohol now  . Drug use: No     Allergies   Patient has no known allergies.   Review of Systems Review of Systems Pertinent negatives listed in HPI  Physical Exam Triage Vital Signs ED Triage Vitals  Enc Vitals Group  BP 11/04/20 1306 100/69     Pulse Rate 11/04/20 1306 82     Resp 11/04/20 1306 16     Temp 11/04/20 1306 98.1 F (36.7 C)     Temp Source 11/04/20 1306 Oral     SpO2 11/04/20 1306 97 %     Weight --      Height --      Head Circumference --      Peak Flow --      Pain Score 11/04/20 1305 0     Pain Loc --      Pain Edu? --      Excl. in [place]? --    No data found.  Updated Vital Signs BP 100/69 (BP Location: Left Arm)   Pulse 82   Temp 98.1 F (36.7 C) (Oral)   Resp 16   LMP 12/29/2016   SpO2 97%   Visual Acuity Right Eye Distance:   Left Eye Distance:   Bilateral Distance:    Right Eye Near:   Left Eye Near:    Bilateral Near:     Physical Exam  General Appearance:    Alert, cooperative, no distress  HENT:   Normocephalic, right ear normal (cerumen impaction) ears normal, nares mucosal edema with congestion, rhinorrhea, oropharynx    Eyes:    PERRL, conjunctiva/corneas clear, EOM's intact       Lungs:     Clear to auscultation bilaterally, respirations unlabored  Heart:    Regular rate and rhythm  Neurologic:   Awake, alert, oriented x 3. No apparent focal neurological           defect.       UC Treatments / Results  Labs (all labs ordered are listed, but only abnormal results are displayed) Labs Reviewed - No data to display  EKG   Radiology No results found.  Procedures Procedures (including critical care time)  Medications Ordered in UC Medications - No data to display  Initial Impression / Assessment and Plan / UC Course  I have reviewed the triage vital signs and the nursing notes.  Pertinent labs & imaging results that were available during my care of the patient were reviewed by me and considered  in my medical decision making (see chart for details).     Cerumen impaction involving the right ear, successfully removed with ear lavage.  No evidence of inner ear infection.  Treating for acute sinusitis with Omnicef 300 mg twice daily for 5 days as patient reports she had discomfort with amoxicillin.  Advised to continue OTC antihistamine.  Follow-up with PCP as needed. Final Clinical Impressions(s) / UC Diagnoses   Final diagnoses:  Acute non-recurrent sinusitis, unspecified location  Impacted cerumen of right ear   Discharge Instructions   None    ED Prescriptions    Medication Sig Dispense Auth. Provider   cefdinir (OMNICEF) 300 MG capsule Take 1 capsule (300 mg total) by mouth 2 (two) times daily for 5 days. 10 capsule Scot Jun, FNP     PDMP not reviewed this encounter.   Scot Jun, FNP 11/04/20 [phone]

## 2020-11-04 NOTE — ED Triage Notes (Signed)
Pt presents with sinus pressure and drainage, ear pain and dizziness xs 6 days. Has not used any OTC medications.

## 2020-12-29 ENCOUNTER — Encounter: Payer: Self-pay | Admitting: *Deleted

## 2020-12-29 ENCOUNTER — Other Ambulatory Visit: Payer: Self-pay

## 2020-12-29 ENCOUNTER — Ambulatory Visit
Admission: EM | Admit: 2020-12-29 | Discharge: 2020-12-29 | Disposition: A | Discharge status: Home / Self Care | Payer: Medicare HMO | Attending: Student | Admitting: Student

## 2020-12-29 DIAGNOSIS — J069 Acute upper respiratory infection, unspecified: Secondary | ICD-10-CM | POA: Diagnosis not present

## 2020-12-29 DIAGNOSIS — Z20822 Contact with and (suspected) exposure to covid-19: Secondary | ICD-10-CM

## 2020-12-29 DIAGNOSIS — R110 Nausea: Secondary | ICD-10-CM | POA: Diagnosis not present

## 2020-12-29 DIAGNOSIS — K219 Gastro-esophageal reflux disease without esophagitis: Secondary | ICD-10-CM

## 2020-12-29 DIAGNOSIS — Z760 Encounter for issue of repeat prescription: Secondary | ICD-10-CM

## 2020-12-29 MED ORDER — ONDANSETRON 8 MG PO TBDP: 8.0000 mg | ORAL_TABLET | Freq: Three times a day (TID) | ORAL | 0 refills | Status: DC | PRN

## 2020-12-29 MED ORDER — FAMOTIDINE 20 MG PO TABS: 20.0000 mg | ORAL_TABLET | Freq: Two times a day (BID) | ORAL | 2 refills | Status: AC

## 2020-12-29 MED ORDER — PANTOPRAZOLE SODIUM 40 MG PO TBEC: 40.0000 mg | DELAYED_RELEASE_TABLET | Freq: Every day | ORAL | 2 refills | Status: DC

## 2020-12-29 NOTE — ED Provider Notes (Signed)
[name]    CSN: [id number] Arrival date & time: 12/29/20  1608      History   Chief Complaint Chief Complaint  Patient presents with   Cough   Chills   Epigastric pain    HPI [name] is a 55 y.o. female presenting with cough, chills, epigastric pain x3 days. Medical history GERD that is reasonably well controlled on protonix but she is out of this.  Hacking cough that is occasionally productive of white sputum.  Subjective chills, denies fevers at home.  Generalized body aches.  Nausea without vomiting.  Occasional watery diarrhea.  States that her chronic acid reflux is currently exacerbated, with epigastric burning and acid coming up of her esophagus after eating.  States that her taste is diminished.  Denies hematemesis, melena, hematochezia, severe abdominal pain, change in bowel or bladder function.  HPI  Past Medical History:  Diagnosis Date   Anxiety    Phreesia 08/06/2020   Arthritis    right leg   Depression    no meds currently   Depression    Phreesia 08/06/2020   GERD (gastroesophageal reflux disease)    Glaucoma    MLH1-related Lynch syndrome (HNPCC2)    Seizures ([place])    Only one back in 2004, none since, no meds, very stressed, patient just lost her husband   SVD (spontaneous vaginal delivery)    x 4    Patient Active Problem List   Diagnosis Date Noted   Numbness and tingling in right hand 08/19/2020   Psychophysiological insomnia 08/19/2020   Gastroesophageal reflux disease 08/18/2020   Severe episode of recurrent major depressive disorder, without psychotic features ([place]) 08/18/2020   Pain of left heel 08/18/2020   Alcohol use disorder, moderate, dependence ([place]) 06/18/2016   MDD (major depressive disorder), single episode, severe with psychotic features ([place]) 06/17/2016   Genetic testing 03/28/2016   MLH1-related Lynch syndrome (HNPCC2) 03/28/2016   Family history of colon cancer 10/16/2015   Family history of ovarian  cancer 10/16/2015   Family history of breast cancer in female 10/16/2015   History of colonic polyps 10/16/2015    Past Surgical History:  Procedure Laterality Date   ABDOMINAL HYSTERECTOMY     ANKLE SURGERY Right 2001   skin graft and bone replacment from right hip muscle fats from abdomen   CO2 LASER APPLICATION     COLONOSCOPY WITH PROPOFOL N/A 02/09/2016   Procedure: COLONOSCOPY WITH PROPOFOL;  Surgeon: Garlan Fair, MD;  Location: [facility];  Service: Endoscopy;  Laterality: N/A;   CYSTOSCOPY  03/17/2017   Procedure: CYSTOSCOPY;  Surgeon: Arvella Nigh, MD;  Location[facility]  Service: Gynecology;;   ESOPHAGOGASTRODUODENOSCOPY (EGD) WITH PROPOFOL N/A 02/09/2016   Procedure: ESOPHAGOGASTRODUODENOSCOPY (EGD) WITH PROPOFOL;  Surgeon: Garlan Fair, MD;  Location: [facility];  Service: Endoscopy;  Laterality: N/A;   JOINT REPLACEMENT N/A    Phreesia 08/06/2020   LAPAROSCOPIC VAGINAL HYSTERECTOMY WITH SALPINGO OOPHORECTOMY Bilateral 03/17/2017   Procedure: LAPAROSCOPIC ASSISTED VAGINAL HYSTERECTOMY WITH SALPINGO OOPHORECTOMY;  Surgeon: Arvella Nigh, MD;  Location: [place];  Service: Gynecology;  Laterality: Bilateral;   right arm surgery     x 2    TUBAL LIGATION     WISDOM TOOTH EXTRACTION      OB History   No obstetric history on file.      Home Medications    Prior to Admission medications   Medication Sig Start Date End Date Taking? Authorizing Provider  famotidine (PEPCID) 20 MG tablet Take 1 tablet (20 mg total) by mouth 2 (two) times daily. 12/29/20  Yes Hazel Sams, PA-C  ondansetron (ZOFRAN ODT) 8 MG disintegrating tablet Take 1 tablet (8 mg total) by mouth every 8 (eight) hours as needed for nausea or vomiting. 12/29/20  Yes Hazel Sams, PA-C  azelastine (OPTIVAR) 0.05 % ophthalmic solution Apply 1 drop to eye 2 (two) times daily. 04/28/20   [provider]  hydrOXYzine (ATARAX/VISTARIL) 50 MG tablet  Take 1/2 - 1 full tab PO QHS PRN for insomnia 08/18/20   Mayers, Cari S, PA-C  pantoprazole (PROTONIX) 40 MG tablet Take 1 tablet (40 mg total) by mouth daily. 12/29/20   Hazel Sams, PA-C  sertraline (ZOLOFT) 25 MG tablet TAKE ONE TABLET DAILY. IF TOLERATING, INCREASE TO TWO TABLETS IN ONE WEEK. 08/28/20   Nicolette Bang, DO    Family History Family History  Problem Relation Age of Onset   Other Mother        hx of TAH-BSO in her late 20s-30 for unspecified cause   Colon cancer Father        dx. early-mid-40s with later "recurrence"   Ovarian cancer Sister 48   Colon cancer Brother        dx. 4s; +surgery and chemotherapy   Colon cancer Paternal Aunt 15   Colon cancer Paternal Uncle 73   Cancer Maternal Grandmother        NOS cancer, d. 58s   Breast cancer Paternal Grandmother        d. early 16s   Colon polyps Other        "many"; dx. late 57s or younger   Other Daughter        breast issues--infection and fluid had to be removed; also has issues with on-going periods; dx. 36 or younger   Other Daughter        cyst on her fallopian tube, being monitored; dx. 106 or younger   Other Brother        issues with recent illness; bloodwork to check for cancer - age 29   Other Brother        issues with swollen prostate; GI issues; age 23   Cancer Cousin 18       paternal 1st cousin dx. "bowel" cancer   Colon cancer Cousin 74       paternal 1st cousin   Colon cancer Cousin 94       paternal 1st cousin   Colon cancer Cousin 24       paternal 1st cousin   Colon cancer Cousin        paternal 1st cousin at unspecified age   Colon cancer Cousin 1       daughter of affected paternal 54st cousin   Uterine cancer Cousin        paternal 1st cousin, d. 11s   Schizophrenia Son    Drug abuse Son    Cancer Brother        paternal half-brother dx. in his 51s; NOS cancer   Prostate cancer Brother        paternal half-brother d. 68s    Social History Social History    Tobacco Use   Smoking status: Never   Smokeless tobacco: Never  Vaping Use   Vaping Use: Never used  Substance Use Topics   Alcohol use: Yes    Comment: rarely   Drug use: No     Allergies  Patient has no known allergies.   Review of Systems Review of Systems  Constitutional:  Negative for appetite change, chills and fever.  HENT:  Positive for congestion. Negative for ear pain, rhinorrhea, sinus pressure, sinus pain and sore throat.   Eyes:  Negative for redness and visual disturbance.  Respiratory:  Positive for cough. Negative for chest tightness, shortness of breath and wheezing.   Cardiovascular:  Negative for chest pain and palpitations.  Gastrointestinal:  Positive for diarrhea and nausea. Negative for abdominal pain, constipation and vomiting.  Genitourinary:  Negative for dysuria, frequency and urgency.  Musculoskeletal:  Negative for myalgias.  Neurological:  Negative for dizziness, weakness and headaches.  Psychiatric/Behavioral:  Negative for confusion.   All other systems reviewed and are negative.   Physical Exam Triage Vital Signs ED Triage Vitals [12/29/20 1730]  Enc Vitals Group     BP 129/75     Pulse Rate 84     Resp 16     Temp 97.7 F (36.5 C)     Temp Source Temporal     SpO2 96 %     Weight      Height      Head Circumference      Peak Flow      Pain Score 9     Pain Loc      Pain Edu?      Excl. in [place]?    No data found.  Updated Vital Signs BP 129/75 (BP Location: Left Arm)   Pulse 84   Temp 97.7 F (36.5 C) (Temporal)   Resp 16   LMP 12/29/2016   SpO2 96%   Visual Acuity Right Eye Distance:   Left Eye Distance:   Bilateral Distance:    Right Eye Near:   Left Eye Near:    Bilateral Near:     Physical Exam Vitals reviewed.  Constitutional:      General: She is not in acute distress.    Appearance: Normal appearance. She is not ill-appearing.  HENT:     Head: Normocephalic and atraumatic.     Right Ear: Hearing,  tympanic membrane, ear canal and external ear normal. No swelling or tenderness. There is no impacted cerumen. No mastoid tenderness. Tympanic membrane is not perforated, erythematous, retracted or bulging.     Left Ear: Hearing, tympanic membrane, ear canal and external ear normal. No swelling or tenderness. There is no impacted cerumen. No mastoid tenderness. Tympanic membrane is not perforated, erythematous, retracted or bulging.     Nose:     Right Sinus: No maxillary sinus tenderness or frontal sinus tenderness.     Left Sinus: No maxillary sinus tenderness or frontal sinus tenderness.     Mouth/Throat:     Mouth: Mucous membranes are moist.     Pharynx: Uvula midline. Posterior oropharyngeal erythema present. No oropharyngeal exudate.     Tonsils: No tonsillar exudate.     Comments: Smooth erythema posterior pharynx On exam, uvula is midline, she is tolerating her secretions without difficulty, there is no trismus, no drooling, she has normal phonation  Cardiovascular:     Rate and Rhythm: Normal rate and regular rhythm.     Heart sounds: Normal heart sounds.  Pulmonary:     Breath sounds: Normal breath sounds and air entry. No wheezing, rhonchi or rales.  Chest:     Chest wall: No tenderness.  Abdominal:     General: Abdomen is flat. Bowel sounds are normal.     Tenderness:  There is abdominal tenderness in the epigastric area. There is no right CVA tenderness, left CVA tenderness, guarding or rebound. Negative signs include Murphy's sign, Rovsing's sign and McBurney's sign.     Comments: Mild epigastric TTP without rebound or guarding  Lymphadenopathy:     Cervical: No cervical adenopathy.  Neurological:     General: No focal deficit present.     Mental Status: She is alert and oriented to person, place, and time.  Psychiatric:        Attention and Perception: Attention and perception normal.        Mood and Affect: Mood and affect normal.        Behavior: Behavior normal.  Behavior is cooperative.        Thought Content: Thought content normal.        Judgment: Judgment normal.     UC Treatments / Results  Labs (all labs ordered are listed, but only abnormal results are displayed) Labs Reviewed  NOVEL CORONAVIRUS, NAA    EKG   Radiology No results found.  Procedures Procedures (including critical care time)  Medications Ordered in UC Medications - No data to display  Initial Impression / Assessment and Plan / UC Course  I have reviewed the triage vital signs and the nursing notes.  Pertinent labs & imaging results that were available during my care of the patient were reviewed by me and considered in my medical decision making (see chart for details).     This patient is a 55 year old woman presenting with viral URI with cough and acute exacerbation of her chronic GERD due to running out of Protonix. Today this pt is afebrile nontachycardic nontachypneic, oxygenating well on room air, no wheezes rhonchi or rales.  There is some reproducible epigastric tenderness to palpation, but this is fairly mild.  She denies hematemesis, melena, hematochezia; low suspicion for peptic ulcer or GI bleed.  Refilled Protonix and sent Pepcid and Zofran as below. Hysterectomy for contraception COVID PCR sent as below. ED return precautions discussed. Patient verbalizes understanding and agreement.    Final Clinical Impressions(s) / UC Diagnoses   Final diagnoses:  Encounter for laboratory testing for COVID-19 virus  Gastroesophageal reflux disease, unspecified whether esophagitis present  Nausea without vomiting  Viral URI  Medication refill     Discharge Instructions      -Restart the protonix once daily  -Pepcid as needed with meals, up to 2x daily -Take the Zofran (ondansetron) up to 3 times daily for nausea and vomiting. Dissolve one pill under your tongue or between your teeth and your cheek. -Drink plenty of fluids and eat a bland diet as  tolerated. Limit spicy and acidic foods like coffee and oranges.  -Seek additional immediate medical attention if you develop severe abdominal pain, you vomit blood, there is blood in your stool, you feel dizzy.  Head to the emergency department with the symptoms.     ED Prescriptions     Medication Sig Dispense Auth. Provider   famotidine (PEPCID) 20 MG tablet Take 1 tablet (20 mg total) by mouth 2 (two) times daily. 30 tablet Hazel Sams, PA-C   ondansetron (ZOFRAN ODT) 8 MG disintegrating tablet Take 1 tablet (8 mg total) by mouth every 8 (eight) hours as needed for nausea or vomiting. 20 tablet Hazel Sams, PA-C   pantoprazole (PROTONIX) 40 MG tablet Take 1 tablet (40 mg total) by mouth daily. 30 tablet [place] Reader      PDMP not  reviewed this encounter.   Hazel Sams, PA-C 12/29/20 1820

## 2020-12-29 NOTE — Discharge Instructions (Addendum)
-  Restart the protonix once daily  -Pepcid as needed with meals, up to 2x daily -Take the Zofran (ondansetron) up to 3 times daily for nausea and vomiting. Dissolve one pill under your tongue or between your teeth and your cheek. -Drink plenty of fluids and eat a bland diet as tolerated. Limit spicy and acidic foods like coffee and oranges.  -Seek additional immediate medical attention if you develop severe abdominal pain, you vomit blood, there is blood in your stool, you feel dizzy.  Head to the emergency department with the symptoms.

## 2020-12-29 NOTE — ED Triage Notes (Signed)
C/O cough, chills, body aches, nausea, slight diarrhea.  Reports change in taste. C/O "acid reflux"; states "everything I put in my mouth just feels like it comes right back up".  C/O postnasal drainage and "whitish ball" of sputum when coughing.

## 2020-12-30 LAB — SARS-COV-2, NAA 2 DAY TAT

## 2020-12-30 LAB — NOVEL CORONAVIRUS, NAA: SARS-CoV-2, NAA: NOT DETECTED

## 2021-01-27 ENCOUNTER — Ambulatory Visit
Admission: EM | Admit: 2021-01-27 | Discharge: 2021-01-27 | Disposition: A | Discharge status: Home / Self Care | Payer: Medicare HMO | Attending: Student | Admitting: Student

## 2021-01-27 ENCOUNTER — Encounter: Payer: Self-pay | Admitting: Emergency Medicine

## 2021-01-27 ENCOUNTER — Other Ambulatory Visit: Payer: Self-pay

## 2021-01-27 DIAGNOSIS — M5441 Lumbago with sciatica, right side: Secondary | ICD-10-CM

## 2021-01-27 DIAGNOSIS — Z0289 Encounter for other administrative examinations: Secondary | ICD-10-CM

## 2021-01-27 MED ORDER — TIZANIDINE HCL 2 MG PO CAPS: 2.0000 mg | ORAL_CAPSULE | Freq: Three times a day (TID) | ORAL | 0 refills | Status: DC

## 2021-01-27 MED ORDER — PREDNISONE 20 MG PO TABS: 40.0000 mg | ORAL_TABLET | Freq: Every day | ORAL | 0 refills | Status: AC

## 2021-01-27 NOTE — ED Provider Notes (Signed)
[name]    CSN: [id number] Arrival date & time: 01/27/21  1452      History   Chief Complaint Chief Complaint  Patient presents with   Back Pain    HPI [name] is a 55 y.o. female presenting with right lower back pain x2 weeks following prolonged standing and lifting at new job.  Medical history arthritis. Describes right lumbar paraspinous muscle tenderness, constant but worse with movement, and radiation of pain down back of R leg and buttock x2 weeks. Has not tried any medications for her symptoms. Has had similar symptoms years ago. Denies urinary symptoms. Denies numbness in arms/legs, denies weakness in arms/legs, denies saddle anesthesia, denies bowel/bladder incontinence, denies urinary retention, denies constipation.   HPI  Past Medical History:  Diagnosis Date   Anxiety    Phreesia 08/06/2020   Arthritis    right leg   Depression    no meds currently   Depression    Phreesia 08/06/2020   GERD (gastroesophageal reflux disease)    Glaucoma    MLH1-related Lynch syndrome (HNPCC2)    Seizures ([place])    Only one back in 2004, none since, no meds, very stressed, patient just lost her husband   SVD (spontaneous vaginal delivery)    x 4    Patient Active Problem List   Diagnosis Date Noted   Numbness and tingling in right hand 08/19/2020   Psychophysiological insomnia 08/19/2020   Gastroesophageal reflux disease 08/18/2020   Severe episode of recurrent major depressive disorder, without psychotic features ([place]) 08/18/2020   Pain of left heel 08/18/2020   Alcohol use disorder, moderate, dependence ([place]) 06/18/2016   MDD (major depressive disorder), single episode, severe with psychotic features ([place]) 06/17/2016   Genetic testing 03/28/2016   MLH1-related Lynch syndrome (HNPCC2) 03/28/2016   Family history of colon cancer 10/16/2015   Family history of ovarian cancer 10/16/2015   Family history of breast cancer in female 10/16/2015    History of colonic polyps 10/16/2015    Past Surgical History:  Procedure Laterality Date   ABDOMINAL HYSTERECTOMY     ANKLE SURGERY Right 2001   skin graft and bone replacment from right hip muscle fats from abdomen   CO2 LASER APPLICATION     COLONOSCOPY WITH PROPOFOL N/A 02/09/2016   Procedure: COLONOSCOPY WITH PROPOFOL;  Surgeon: Garlan Fair, MD;  Location: [facility];  Service: Endoscopy;  Laterality: N/A;   CYSTOSCOPY  03/17/2017   Procedure: CYSTOSCOPY;  Surgeon: Arvella Nigh, MD;  Location: [facility];  Service: Gynecology;;   ESOPHAGOGASTRODUODENOSCOPY (EGD) WITH PROPOFOL N/A 02/09/2016   Procedure: ESOPHAGOGASTRODUODENOSCOPY (EGD) WITH PROPOFOL;  Surgeon: Garlan Fair, MD;  Location: [facility];  Service: Endoscopy;  Laterality: N/A;   JOINT REPLACEMENT N/A    Phreesia 08/06/2020   LAPAROSCOPIC VAGINAL HYSTERECTOMY WITH SALPINGO OOPHORECTOMY Bilateral 03/17/2017   Procedure: LAPAROSCOPIC ASSISTED VAGINAL HYSTERECTOMY WITH SALPINGO OOPHORECTOMY;  Surgeon: Arvella Nigh, MD;  Location: [facility] [facility];  Service: Gynecology;  Laterality: Bilateral;   right arm surgery     x 2    TUBAL LIGATION     WISDOM TOOTH EXTRACTION      OB History   No obstetric history on file.      Home Medications    Prior to Admission medications   Medication Sig Start Date End Date Taking? Authorizing Provider  azelastine (OPTIVAR) 0.05 % ophthalmic solution Apply 1 drop to eye 2 (two) times daily. 04/28/20  Yes [provider]  famotidine (PEPCID) 20 MG tablet Take 1 tablet (20 mg total) by mouth 2 (two) times daily. 12/29/20  Yes Hazel Sams, PA-C  ondansetron (ZOFRAN ODT) 8 MG disintegrating tablet Take 1 tablet (8 mg total) by mouth every 8 (eight) hours as needed for nausea or vomiting. 12/29/20  Yes Hazel Sams, PA-C  pantoprazole (PROTONIX) 40 MG tablet Take 1 tablet (40 mg total) by mouth daily. 12/29/20  Yes Hazel Sams, PA-C   predniSONE (DELTASONE) 20 MG tablet Take 2 tablets (40 mg total) by mouth daily for 5 days. 01/27/21 02/01/21 Yes Hazel Sams, PA-C  tizanidine (ZANAFLEX) 2 MG capsule Take 1 capsule (2 mg total) by mouth 3 (three) times daily. 01/27/21  Yes Hazel Sams, PA-C    Family History Family History  Problem Relation Age of Onset   Other Mother        hx of TAH-BSO in her late 20s-30 for unspecified cause   Colon cancer Father        dx. early-mid-40s with later "recurrence"   Ovarian cancer Sister 70   Colon cancer Brother        dx. 23s; +surgery and chemotherapy   Colon cancer Paternal Aunt 34   Colon cancer Paternal Uncle 30   Cancer Maternal Grandmother        NOS cancer, d. 62s   Breast cancer Paternal Grandmother        d. early 74s   Colon polyps Other        "many"; dx. late 73s or younger   Other Daughter        breast issues--infection and fluid had to be removed; also has issues with on-going periods; dx. 17 or younger   Other Daughter        cyst on her fallopian tube, being monitored; dx. 28 or younger   Other Brother        issues with recent illness; bloodwork to check for cancer - age 78   Other Brother        issues with swollen prostate; GI issues; age 65   Cancer Cousin 32       paternal 1st cousin dx. "bowel" cancer   Colon cancer Cousin 36       paternal 1st cousin   Colon cancer Cousin 76       paternal 1st cousin   Colon cancer Cousin 74       paternal 1st cousin   Colon cancer Cousin        paternal 1st cousin at unspecified age   Colon cancer Cousin 29       daughter of affected paternal 1st cousin   Uterine cancer Cousin        paternal 1st cousin, d. 3s   Schizophrenia Son    Drug abuse Son    Cancer Brother        paternal half-brother dx. in his 53s; NOS cancer   Prostate cancer Brother        paternal half-brother d. 83s    Social History Social History   Tobacco Use   Smoking status: Never   Smokeless tobacco: Never  Vaping Use    Vaping Use: Never used  Substance Use Topics   Alcohol use: Yes    Comment: rarely   Drug use: No     Allergies   Patient has no known allergies.   Review of Systems Review of Systems  Constitutional:  Negative for chills,  fever and unexpected weight change.  Respiratory:  Negative for chest tightness and shortness of breath.   Cardiovascular:  Negative for chest pain and palpitations.  Gastrointestinal:  Negative for abdominal pain, diarrhea, nausea and vomiting.  Genitourinary:  Negative for decreased urine volume, difficulty urinating and frequency.  Musculoskeletal:  Positive for back pain. Negative for arthralgias, gait problem, joint swelling, myalgias, neck pain and neck stiffness.  Skin:  Negative for wound.  Neurological:  Negative for dizziness, tremors, seizures, syncope, facial asymmetry, speech difficulty, weakness, light-headedness, numbness and headaches.  All other systems reviewed and are negative.   Physical Exam Triage Vital Signs ED Triage Vitals  Enc Vitals Group     BP 01/27/21 1620 122/75     Pulse Rate 01/27/21 1620 98     Resp 01/27/21 1620 18     Temp 01/27/21 1620 98 F (36.7 C)     Temp Source 01/27/21 1620 Oral     SpO2 01/27/21 1620 97 %     Weight 01/27/21 1621 160 lb (72.6 kg)     Height 01/27/21 1621 5' 3"  (1.6 m)     Head Circumference --      Peak Flow --      Pain Score 01/27/21 1621 10     Pain Loc --      Pain Edu? --      Excl. in [place]? --    No data found.  Updated Vital Signs BP 122/75 (BP Location: Left Arm)   Pulse 98   Temp 98 F (36.7 C) (Oral)   Resp 18   Ht 5' 3"  (1.6 m)   Wt 160 lb (72.6 kg)   LMP 12/29/2016   SpO2 97%   BMI 28.34 kg/m   Visual Acuity Right Eye Distance:   Left Eye Distance:   Bilateral Distance:    Right Eye Near:   Left Eye Near:    Bilateral Near:     Physical Exam Vitals reviewed.  Constitutional:      General: She is not in acute distress.    Appearance: Normal appearance.  She is not ill-appearing.  HENT:     Head: Normocephalic and atraumatic.  Cardiovascular:     Rate and Rhythm: Normal rate and regular rhythm.     Heart sounds: Normal heart sounds.  Pulmonary:     Effort: Pulmonary effort is normal.     Breath sounds: Normal breath sounds and air entry.  Abdominal:     Tenderness: There is no abdominal tenderness. There is no right CVA tenderness, left CVA tenderness, guarding or rebound.  Musculoskeletal:     Cervical back: Normal range of motion. No swelling, deformity, signs of trauma, rigidity, spasms, tenderness, bony tenderness or crepitus. No pain with movement.     Thoracic back: No swelling, deformity, signs of trauma, spasms, tenderness or bony tenderness. Normal range of motion. No scoliosis.     Lumbar back: No swelling, deformity, signs of trauma, spasms, tenderness or bony tenderness. Normal range of motion. Negative right straight leg raise test and negative left straight leg raise test. No scoliosis.     Comments: Right-sided lumbar paraspinous muscle tenderness to palpation, pain elicited with flexion lumbar spine.  Positive right straight leg raise.  Gait intact but with pain.  Sensation and strength intact upper and lower extremities, no saddle anesthesia. No midline spinous tenderness, deformity, stepoff.  Absolutely no other injury, deformity, tenderness, ecchymosis, abrasion.  Neurological:     General: No focal deficit present.  Mental Status: She is alert.     Cranial Nerves: No cranial nerve deficit.  Psychiatric:        Mood and Affect: Mood normal.        Behavior: Behavior normal.        Thought Content: Thought content normal.        Judgment: Judgment normal.     UC Treatments / Results  Labs (all labs ordered are listed, but only abnormal results are displayed) Labs Reviewed - No data to display  EKG   Radiology No results found.  Procedures Procedures (including critical care time)  Medications Ordered  in UC Medications - No data to display  Initial Impression / Assessment and Plan / UC Course  I have reviewed the triage vital signs and the nursing notes.  Pertinent labs & imaging results that were available during my care of the patient were reviewed by me and considered in my medical decision making (see chart for details).     This patient is a very pleasant 55 y.o. year old female presenting with right lumbar strain with right-sided sciatica.  No red flag symptoms.   Prednisone, Zanaflex as below, she is not a diabetic.  Work note provided.  ED return precautions discussed. Patient verbalizes understanding and agreement.    Final Clinical Impressions(s) / UC Diagnoses   Final diagnoses:  Acute right-sided back pain with sciatica  Encounter to obtain excuse from work     Discharge Instructions      -Prednisone, 2 pills taken at the same time for 5 days in a row.  Try taking this earlier in the day as it can give you energy. Avoid NSAIDs like ibuprofen and alleve while taking this medication as they can increase your risk of stomach upset and even GI bleeding when in combination with a steroid. You can continue tylenol (acetaminophen) up to 1051m 3x daily. -Start the muscle relaxer-Zanaflex (tizanidine), up to 3 times daily for muscle spasms and pain.  This can make you drowsy, so take at bedtime or when you do not need to drive or operate machinery. -Tylenol for additional relief -Heating pad/warm shower     ED Prescriptions     Medication Sig Dispense Auth. Provider   predniSONE (DELTASONE) 20 MG tablet Take 2 tablets (40 mg total) by mouth daily for 5 days. 10 tablet GHazel Sams PA-C   tizanidine (ZANAFLEX) 2 MG capsule Take 1 capsule (2 mg total) by mouth 3 (three) times daily. 21 capsule GHazel Sams PA-C      PDMP not reviewed this encounter.   GHazel Sams PA-C 01/27/21 1701

## 2021-01-27 NOTE — ED Triage Notes (Signed)
Pt here with lower right back pain that shoots down same leg. Unable to lay on right side. Started at work when pt was on feet. Change of position only helps severe pain and no meds have been taken.

## 2021-01-27 NOTE — Discharge Instructions (Addendum)
-  Prednisone, 2 pills taken at the same time for 5 days in a row.  Try taking this earlier in the day as it can give you energy. Avoid NSAIDs like ibuprofen and alleve while taking this medication as they can increase your risk of stomach upset and even GI bleeding when in combination with a steroid. You can continue tylenol (acetaminophen) up to 1000mg  3x daily. -Start the muscle relaxer-Zanaflex (tizanidine), up to 3 times daily for muscle spasms and pain.  This can make you drowsy, so take at bedtime or when you do not need to drive or operate machinery. -Tylenol for additional relief -Heating pad/warm shower

## 2021-03-22 NOTE — Progress Notes (Signed)
Erroneous encounter

## 2021-03-24 ENCOUNTER — Encounter: Payer: Medicare HMO | Admitting: Family

## 2021-03-24 DIAGNOSIS — Z1231 Encounter for screening mammogram for malignant neoplasm of breast: Secondary | ICD-10-CM

## 2021-03-24 DIAGNOSIS — Z13228 Encounter for screening for other metabolic disorders: Secondary | ICD-10-CM

## 2021-03-24 DIAGNOSIS — Z130 Encounter for screening for diseases of the blood and blood-forming organs and certain disorders involving the immune mechanism: Secondary | ICD-10-CM

## 2021-03-24 DIAGNOSIS — Z131 Encounter for screening for diabetes mellitus: Secondary | ICD-10-CM

## 2021-03-24 DIAGNOSIS — Z1211 Encounter for screening for malignant neoplasm of colon: Secondary | ICD-10-CM

## 2021-03-24 DIAGNOSIS — Z1329 Encounter for screening for other suspected endocrine disorder: Secondary | ICD-10-CM

## 2021-03-24 DIAGNOSIS — Z13220 Encounter for screening for lipoid disorders: Secondary | ICD-10-CM

## 2021-03-24 DIAGNOSIS — Z0000 Encounter for general adult medical examination without abnormal findings: Secondary | ICD-10-CM

## 2021-04-03 ENCOUNTER — Other Ambulatory Visit: Payer: Self-pay

## 2021-04-03 ENCOUNTER — Ambulatory Visit
Admission: EM | Admit: 2021-04-03 | Discharge: 2021-04-03 | Disposition: A | Discharge status: Home / Self Care | Payer: Medicare Other | Attending: Urgent Care | Admitting: Urgent Care

## 2021-04-03 DIAGNOSIS — Z760 Encounter for issue of repeat prescription: Secondary | ICD-10-CM

## 2021-04-03 DIAGNOSIS — J069 Acute upper respiratory infection, unspecified: Secondary | ICD-10-CM | POA: Diagnosis not present

## 2021-04-03 DIAGNOSIS — R070 Pain in throat: Secondary | ICD-10-CM

## 2021-04-03 MED ORDER — CETIRIZINE HCL 10 MG PO TABS: 10.0000 mg | ORAL_TABLET | Freq: Every day | ORAL | 0 refills | Status: DC

## 2021-04-03 MED ORDER — BENZONATATE 100 MG PO CAPS: 100.0000 mg | ORAL_CAPSULE | Freq: Three times a day (TID) | ORAL | 0 refills | Status: DC | PRN

## 2021-04-03 MED ORDER — PROMETHAZINE-DM 6.25-15 MG/5ML PO SYRP: 5.0000 mL | ORAL_SOLUTION | Freq: Every evening | ORAL | 0 refills | Status: DC | PRN

## 2021-04-03 MED ORDER — TIZANIDINE HCL 2 MG PO CAPS: 2.0000 mg | ORAL_CAPSULE | Freq: Three times a day (TID) | ORAL | 0 refills | Status: AC

## 2021-04-03 MED ORDER — PSEUDOEPHEDRINE HCL 30 MG PO TABS: 30.0000 mg | ORAL_TABLET | Freq: Three times a day (TID) | ORAL | 0 refills | Status: DC | PRN

## 2021-04-03 NOTE — ED Triage Notes (Signed)
Pt c/o headache, cough, fatigue, sore throat, nausea. Denies, vomiting, diarrhea, constipation. Onset approx 3 days ago.  States tried ibuprofen pepcid at home without relief.

## 2021-04-03 NOTE — Discharge Instructions (Addendum)
We will notify you of your COVID-19 test results as they arrive and may take between 48-72 hours.  I encourage you to sign up for MyChart if you have not already done so as this can be the easiest way for [place] to communicate results to you online or through a phone app.  Generally, we only contact you if it is a positive COVID result.  In the meantime, if you develop worsening symptoms including fever, chest pain, shortness of breath despite our current treatment plan then please report to the emergency room as this may be a sign of worsening status from possible COVID-19 infection.  Otherwise, we will manage this as a viral syndrome. For sore throat or cough try using a honey-based tea. Use 3 teaspoons of honey with juice squeezed from half lemon. Place shaved pieces of ginger into 1/2-1 cup of water and warm over stove top. Then mix the ingredients and repeat every 4 hours as needed. Please take Tylenol '500mg'$ -'650mg'$  every 6 hours for aches and pains, fevers. Hydrate very well with at least 2 liters of water. Eat light meals such as soups to replenish electrolytes and soft fruits, veggies. Start an antihistamine like Zyrtec, Allegra or Claritin for postnasal drainage, sinus congestion.  You can take this together with pseudoephedrine (Sudafed) at a dose of 30 mg 2-3 times a day as needed for the same kind of congestion.

## 2021-04-03 NOTE — ED Provider Notes (Signed)
[name]   MRN: [id number] DOB: 1966-03-28  Subjective:   [name] is a 55 y.o. female presenting for 3-day history of acute onset sinus headache, coughing, throat pain, nausea without vomiting, fatigue and malaise.  Patient is COVID vaccinated but no booster.  No flu vaccine.  No chest pain, shortness of breath, wheezing.  No history of smoking.  No history of respiratory disorders.  No current facility-administered medications for this encounter.  Current Outpatient Medications:    azelastine (OPTIVAR) 0.05 % ophthalmic solution, Apply 1 drop to eye 2 (two) times daily., Disp: , Rfl:    famotidine (PEPCID) 20 MG tablet, Take 1 tablet (20 mg total) by mouth 2 (two) times daily., Disp: 30 tablet, Rfl: 2   ondansetron (ZOFRAN ODT) 8 MG disintegrating tablet, Take 1 tablet (8 mg total) by mouth every 8 (eight) hours as needed for nausea or vomiting., Disp: 20 tablet, Rfl: 0   pantoprazole (PROTONIX) 40 MG tablet, Take 1 tablet (40 mg total) by mouth daily., Disp: 30 tablet, Rfl: 2   tizanidine (ZANAFLEX) 2 MG capsule, Take 1 capsule (2 mg total) by mouth 3 (three) times daily., Disp: 21 capsule, Rfl: 0   No Known Allergies  Past Medical History:  Diagnosis Date   Anxiety    Phreesia 08/06/2020   Arthritis    right leg   Depression    no meds currently   Depression    Phreesia 08/06/2020   GERD (gastroesophageal reflux disease)    Glaucoma    MLH1-related Lynch syndrome (HNPCC2)    Seizures ([place])    Only one back in 2004, none since, no meds, very stressed, patient just lost her husband   SVD (spontaneous vaginal delivery)    x 4     Past Surgical History:  Procedure Laterality Date   ABDOMINAL HYSTERECTOMY     ANKLE SURGERY Right 2001   skin graft and bone replacment from right hip muscle fats from abdomen   CO2 LASER APPLICATION     COLONOSCOPY WITH PROPOFOL N/A 02/09/2016   Procedure: COLONOSCOPY WITH PROPOFOL;  Surgeon: Garlan Fair, MD;   Location: [facility];  Service: Endoscopy;  Laterality: N/A;   CYSTOSCOPY  03/17/2017   Procedure: CYSTOSCOPY;  Surgeon: Arvella Nigh, MD;  Location: [facility];  Service: Gynecology;;   ESOPHAGOGASTRODUODENOSCOPY (EGD) WITH PROPOFOL N/A 02/09/2016   Procedure: ESOPHAGOGASTRODUODENOSCOPY (EGD) WITH PROPOFOL;  Surgeon: Garlan Fair, MD;  Location: [facility];  Service: Endoscopy;  Laterality: N/A;   JOINT REPLACEMENT N/A    Phreesia 08/06/2020   LAPAROSCOPIC VAGINAL HYSTERECTOMY WITH SALPINGO OOPHORECTOMY Bilateral 03/17/2017   Procedure: LAPAROSCOPIC ASSISTED VAGINAL HYSTERECTOMY WITH SALPINGO OOPHORECTOMY;  Surgeon: Arvella Nigh, MD;  Location: [place];  Service: Gynecology;  Laterality: Bilateral;   right arm surgery     x 2    TUBAL LIGATION     WISDOM TOOTH EXTRACTION      Family History  Problem Relation Age of Onset   Other Mother        hx of TAH-BSO in her late 20s-30 for unspecified cause   Colon cancer Father        dx. early-mid-40s with later "recurrence"   Ovarian cancer Sister 17   Colon cancer Brother        dx. 51s; +surgery and chemotherapy   Colon cancer Paternal Aunt 78   Colon cancer Paternal Uncle 48   Cancer Maternal Grandmother  NOS cancer, d. 91s   Breast cancer Paternal Grandmother        d. early 65s   Colon polyps Other        "many"; dx. late 54s or younger   Other Daughter        breast issues--infection and fluid had to be removed; also has issues with on-going periods; dx. 2 or younger   Other Daughter        cyst on her fallopian tube, being monitored; dx. 61 or younger   Other Brother        issues with recent illness; bloodwork to check for cancer - age 86   Other Brother        issues with swollen prostate; GI issues; age 56   Cancer Cousin 26       paternal 1st cousin dx. "bowel" cancer   Colon cancer Cousin 41       paternal 1st cousin   Colon cancer Cousin 45       paternal 1st cousin    Colon cancer Cousin 40       paternal 1st cousin   Colon cancer Cousin        paternal 1st cousin at unspecified age   Colon cancer Cousin 67       daughter of affected paternal 78st cousin   Uterine cancer Cousin        paternal 1st cousin, d. 81s   Schizophrenia Son    Drug abuse Son    Cancer Brother        paternal half-brother dx. in his 53s; NOS cancer   Prostate cancer Brother        paternal half-brother d. 74s    Social History   Tobacco Use   Smoking status: Never   Smokeless tobacco: Never  Vaping Use   Vaping Use: Never used  Substance Use Topics   Alcohol use: Yes    Comment: rarely   Drug use: No    ROS   Objective:   Vitals: BP 111/74 (BP Location: Left Arm)   Pulse (!) 104   Temp 97.7 F (36.5 C) (Oral)   Resp 20   LMP 12/29/2016   SpO2 96%   Physical Exam Constitutional:      General: She is not in acute distress.    Appearance: Normal appearance. She is well-developed. She is not ill-appearing, toxic-appearing or diaphoretic.  HENT:     Head: Normocephalic and atraumatic.     Right Ear: External ear normal.     Left Ear: External ear normal.     Nose: Nose normal.     Mouth/Throat:     Mouth: Mucous membranes are moist.     Pharynx: Oropharynx is clear. No oropharyngeal exudate or posterior oropharyngeal erythema.  Eyes:     General: No scleral icterus.       Right eye: No discharge.        Left eye: No discharge.     Extraocular Movements: Extraocular movements intact.     Conjunctiva/sclera: Conjunctivae normal.     Pupils: Pupils are equal, round, and reactive to light.  Cardiovascular:     Rate and Rhythm: Normal rate and regular rhythm.     Pulses: Normal pulses.     Heart sounds: Normal heart sounds. No murmur heard.   No friction rub. No gallop.  Pulmonary:     Effort: Pulmonary effort is normal. No respiratory distress.     Breath sounds: Normal breath  sounds. No stridor. No wheezing, rhonchi or rales.  Skin:     General: Skin is warm and dry.     Findings: No rash.  Neurological:     Mental Status: She is alert and oriented to person, place, and time.  Psychiatric:        Mood and Affect: Mood normal.        Behavior: Behavior normal.        Thought Content: Thought content normal.        Judgment: Judgment normal.    Assessment and Plan :   PDMP not reviewed this encounter.  1. Viral URI with cough   2. Throat pain   3. Medication refill     Will manage for viral illness such as viral URI, viral syndrome, viral rhinitis, COVID-19. Counseled patient on nature of COVID-19 including modes of transmission, diagnostic testing, management and supportive care.  Offered scripts for symptomatic relief. COVID 19 testing is pending. Counseled patient on potential for adverse effects with medications prescribed/recommended today, ER and return-to-clinic precautions discussed, patient verbalized understanding.    At discharge, patient requested a refill of her tizanidine.  She uses this for her sciatica and it works very well.  She plans on following up with her PCP.   Jaynee Eagles, PA-C 04/03/21 1229

## 2021-04-04 LAB — COVID-19, FLU A+B NAA
Influenza A, NAA: NOT DETECTED
Influenza B, NAA: NOT DETECTED
SARS-CoV-2, NAA: DETECTED — AB

## 2021-04-06 ENCOUNTER — Other Ambulatory Visit: Payer: Self-pay | Admitting: Physician Assistant

## 2021-04-06 DIAGNOSIS — F5104 Psychophysiologic insomnia: Secondary | ICD-10-CM

## 2021-06-17 ENCOUNTER — Encounter: Payer: Medicare Other | Admitting: Family

## 2021-06-17 NOTE — Progress Notes (Signed)
Erroneous encounter

## 2021-10-02 NOTE — Progress Notes (Signed)
Erroneous encounter

## 2021-10-06 ENCOUNTER — Encounter: Payer: Medicare Other | Admitting: Family

## 2021-10-06 DIAGNOSIS — Z1231 Encounter for screening mammogram for malignant neoplasm of breast: Secondary | ICD-10-CM

## 2021-10-29 NOTE — Progress Notes (Signed)
Erroneous encounter

## 2021-11-04 ENCOUNTER — Encounter: Payer: Medicare Other | Admitting: Family

## 2021-11-04 DIAGNOSIS — Z13220 Encounter for screening for lipoid disorders: Secondary | ICD-10-CM

## 2021-11-04 DIAGNOSIS — Z1231 Encounter for screening mammogram for malignant neoplasm of breast: Secondary | ICD-10-CM

## 2021-11-04 DIAGNOSIS — Z131 Encounter for screening for diabetes mellitus: Secondary | ICD-10-CM

## 2021-11-04 DIAGNOSIS — Z1329 Encounter for screening for other suspected endocrine disorder: Secondary | ICD-10-CM

## 2021-11-04 DIAGNOSIS — Z0000 Encounter for general adult medical examination without abnormal findings: Secondary | ICD-10-CM

## 2021-11-04 DIAGNOSIS — Z130 Encounter for screening for diseases of the blood and blood-forming organs and certain disorders involving the immune mechanism: Secondary | ICD-10-CM

## 2021-11-04 DIAGNOSIS — Z13228 Encounter for screening for other metabolic disorders: Secondary | ICD-10-CM

## 2022-09-02 ENCOUNTER — Other Ambulatory Visit: Payer: Self-pay | Admitting: Gastroenterology

## 2022-09-02 DIAGNOSIS — Z1509 Genetic susceptibility to other malignant neoplasm: Secondary | ICD-10-CM

## 2022-10-12 ENCOUNTER — Other Ambulatory Visit: Payer: 59

## 2022-11-04 ENCOUNTER — Other Ambulatory Visit: Payer: 59

## 2022-11-26 ENCOUNTER — Ambulatory Visit
Admission: RE | Admit: 2022-11-26 | Discharge: 2022-11-26 | Disposition: A | Discharge status: Home / Self Care | Payer: 59 | Source: Ambulatory Visit | Attending: Gastroenterology | Admitting: Gastroenterology

## 2022-11-26 DIAGNOSIS — Z1509 Genetic susceptibility to other malignant neoplasm: Secondary | ICD-10-CM

## 2022-11-26 MED ORDER — GADOPICLENOL 0.5 MMOL/ML IV SOLN
7.0000 mL | Freq: Once | INTRAVENOUS | Status: AC | PRN
  Administered 2022-11-26: 7 mL via INTRAVENOUS

## 2024-04-11 ENCOUNTER — Ambulatory Visit: Payer: Self-pay | Admitting: Nurse Practitioner

## 2024-04-11 ENCOUNTER — Ambulatory Visit
Admission: EM | Admit: 2024-04-11 | Discharge: 2024-04-11 | Disposition: A | Discharge status: Home / Self Care | Attending: Family Medicine | Admitting: Family Medicine

## 2024-04-11 ENCOUNTER — Ambulatory Visit (INDEPENDENT_AMBULATORY_CARE_PROVIDER_SITE_OTHER)

## 2024-04-11 ENCOUNTER — Other Ambulatory Visit: Payer: Self-pay

## 2024-04-11 DIAGNOSIS — R051 Acute cough: Secondary | ICD-10-CM

## 2024-04-11 DIAGNOSIS — J329 Chronic sinusitis, unspecified: Secondary | ICD-10-CM | POA: Diagnosis not present

## 2024-04-11 DIAGNOSIS — J40 Bronchitis, not specified as acute or chronic: Secondary | ICD-10-CM | POA: Diagnosis not present

## 2024-04-11 MED ORDER — BENZONATATE 100 MG PO CAPS: 100.0000 mg | ORAL_CAPSULE | Freq: Three times a day (TID) | ORAL | 0 refills | Status: AC

## 2024-04-11 MED ORDER — AMOXICILLIN-POT CLAVULANATE 875-125 MG PO TABS: 1.0000 | ORAL_TABLET | Freq: Two times a day (BID) | ORAL | 0 refills | Status: AC

## 2024-04-11 NOTE — Discharge Instructions (Signed)
 Start Augmentin  twice daily for 7 days.  May take Tessalon  3 times a day as needed for your cough.  Lots of rest and fluids.  Please follow-up with your PCP if your symptoms do not improve.  Please go to the ER if you develop any worsening symptoms.  Hope you feel better soon!

## 2024-04-11 NOTE — ED Triage Notes (Addendum)
 Pt c/o productive cough w/yellow mucous, HA, fatigue, loss of appetitex1wk. Pt states has tried tylenol  and OTC cough medicines, but has not helped

## 2024-04-11 NOTE — ED Provider Notes (Signed)
 [facility]    CSN: [id number] Arrival date & time: 04/11/24  9051      History   Chief Complaint No chief complaint on file.   HPI [name] is a 58 y.o. female  presents for evaluation of URI symptoms for 7 days. Patient reports associated symptoms of cough, congestion with headache, fatigue, loss of appetite,  sinus pressure. Denies N/V/D, fevers, ear pain, body aches, shortness of breath. Patient does not have a hx of asthma. Patient is not an active smoker.   Reports sick contacts via family members.  Pt has taken Tylenol  Cold and sinus OTC for symptoms. Pt has no other concerns at this time.   HPI  Past Medical History:  Diagnosis Date   Anxiety    Phreesia 08/06/2020   Arthritis    right leg   Depression    no meds currently   Depression    [employer]reesia 08/06/2020   GERD (gastroesophageal reflux disease)    Glaucoma    MLH1-related Lynch syndrome (HNPCC2)    Seizures (HCC)    Only one back in 2004, none since, no meds, very stressed, patient just lost her husband   SVD (spontaneous vaginal delivery)    x 4    Patient Active Problem List   Diagnosis Date Noted   Numbness and tingling in right hand 08/19/2020   Psychophysiological insomnia 08/19/2020   Gastroesophageal reflux disease 08/18/2020   Severe episode of recurrent major depressive disorder, without psychotic features (HCC) 08/18/2020   Pain of left heel 08/18/2020   Alcohol use disorder, moderate, dependence (HCC) 06/18/2016   MDD (major depressive disorder), single episode, severe with psychotic features (HCC) 06/17/2016   Genetic testing 03/28/2016   MLH1-related Lynch syndrome (HNPCC2) 03/28/2016   Family history of colon cancer 10/16/2015   Family history of ovarian cancer 10/16/2015   Family history of breast cancer in female 10/16/2015   History of colonic polyps 10/16/2015    Past Surgical History:  Procedure Laterality Date   ABDOMINAL HYSTERECTOMY     ANKLE SURGERY Right  2001   skin graft and bone replacment from right hip muscle fats from abdomen   CO2 LASER APPLICATION     COLONOSCOPY WITH PROPOFOL  N/A 02/09/2016   Procedure: COLONOSCOPY WITH PROPOFOL ;  Surgeon: Gladis MARLA Louder, MD;  Location: [facility];  Service: Endoscopy;  Laterality: N/A;   CYSTOSCOPY  03/17/2017   Procedure: CYSTOSCOPY;  Surgeon: Leva Rush, MD;  Location: [facility];  Service: Gynecology;;   ESOPHAGOGASTRODUODENOSCOPY (EGD) WITH PROPOFOL  N/A 02/09/2016   Procedure: ESOPHAGOGASTRODUODENOSCOPY (EGD) WITH PROPOFOL ;  Surgeon: Gladis MARLA Louder, MD;  Location: [facility];  Service: Endoscopy;  Laterality: N/A;   JOINT REPLACEMENT N/A    Phreesia 08/06/2020   LAPAROSCOPIC VAGINAL HYSTERECTOMY WITH SALPINGO OOPHORECTOMY Bilateral 03/17/2017   Procedure: LAPAROSCOPIC ASSISTED VAGINAL HYSTERECTOMY WITH SALPINGO OOPHORECTOMY;  Surgeon: Leva Rush, MD;  Location: [facility];  Service: Gynecology;  Laterality: Bilateral;   right arm surgery     x 2    TUBAL LIGATION     WISDOM TOOTH EXTRACTION      OB History   No obstetric history on file.      Home Medications    Prior to Admission medications   Medication Sig Start Date End Date Taking? Authorizing Provider  amoxicillin -clavulanate (AUGMENTIN ) 875-125 MG tablet Take 1 tablet by mouth every 12 (twelve) hours. 04/11/24  Yes Deiontae Rabel, Jodi R, NP  benzonatate  (TESSALON ) 100 MG capsule Take 1  capsule (100 mg total) by mouth every 8 (eight) hours. 04/11/24  Yes Kion Huntsberry, Jodi R, NP  escitalopram (LEXAPRO) 5 MG tablet Take 5 mg by mouth every morning. 03/13/24  Yes [provider]  traZODone  (DESYREL ) 50 MG tablet Take 50 mg by mouth at bedtime. 01/25/24  Yes [provider]  famotidine  (PEPCID ) 20 MG tablet Take 1 tablet (20 mg total) by mouth 2 (two) times daily. 12/29/20   Graham, Laura E, PA-C  tizanidine  (ZANAFLEX ) 2 MG capsule Take 1 capsule (2 mg total) by mouth 3 (three) times daily.  04/03/21   Christopher Savannah, PA-C    Family History Family History  Problem Relation Age of Onset   Other Mother        hx of TAH-BSO in her late 20s-30 for unspecified cause   Colon cancer Father        dx. early-mid-40s with later recurrence   Ovarian cancer Sister 13   Colon cancer Brother        dx. 30s; +surgery and chemotherapy   Colon cancer Paternal Aunt 35   Colon cancer Paternal Uncle 36   Cancer Maternal Grandmother        NOS cancer, d. 30s   Breast cancer Paternal Grandmother        d. early 49s   Colon polyps Other        many; dx. late 50s or younger   Other Daughter        breast issues--infection and fluid had to be removed; also has issues with on-going periods; dx. 41 or younger   Other Daughter        cyst on her fallopian tube, being monitored; dx. 63 or younger   Other Brother        issues with recent illness; bloodwork to check for cancer - age 72   Other Brother        issues with swollen prostate; GI issues; age 63   Cancer Cousin 73       paternal 1st cousin dx. bowel cancer   Colon cancer Cousin 50       paternal 1st cousin   Colon cancer Cousin 8       paternal 1st cousin   Colon cancer Cousin 58       paternal 1st cousin   Colon cancer Cousin        paternal 1st cousin at unspecified age   Colon cancer Cousin 5       daughter of affected paternal 1st cousin   Uterine cancer Cousin        paternal 1st cousin, d. 97s   Schizophrenia Son    Drug abuse Son    Cancer Brother        paternal half-brother dx. in his 30s; NOS cancer   Prostate cancer Brother        paternal half-brother d. 55s    Social History Social History   Tobacco Use   Smoking status: Never   Smokeless tobacco: Never  Vaping Use   Vaping status: Never Used  Substance Use Topics   Alcohol use: Yes    Comment: rarely   Drug use: No     Allergies   Patient has no known allergies.   Review of Systems Review of Systems  Constitutional:  Positive for  appetite change and fatigue.  HENT:  Positive for congestion.   Respiratory:  Positive for cough.   Neurological:  Positive for headaches.  Physical Exam Triage Vital Signs ED Triage Vitals  Encounter Vitals Group     BP 04/11/24 1026 107/70     Girls Systolic BP Percentile --      Girls Diastolic BP Percentile --      Boys Systolic BP Percentile --      Boys Diastolic BP Percentile --      Pulse Rate 04/11/24 1026 (!) 112     Resp 04/11/24 1026 17     Temp 04/11/24 1026 98.9 F (37.2 C)     Temp Source 04/11/24 1026 Oral     SpO2 04/11/24 1026 94 %     Weight --      Height --      Head Circumference --      Peak Flow --      Pain Score 04/11/24 1023 8     Pain Loc --      Pain Education --      Exclude from Growth Chart --    No data found.  Updated Vital Signs BP 107/70   Pulse (!) 112   Temp 98.9 F (37.2 C) (Oral)   Resp 17   LMP 12/29/2016   SpO2 94%   Visual Acuity Right Eye Distance:   Left Eye Distance:   Bilateral Distance:    Right Eye Near:   Left Eye Near:    Bilateral Near:     Physical Exam Vitals and nursing note reviewed.  Constitutional:      General: She is not in acute distress.    Appearance: She is well-developed. She is not ill-appearing.  HENT:     Head: Normocephalic and atraumatic.     Right Ear: Tympanic membrane and ear canal normal.     Left Ear: Tympanic membrane and ear canal normal.     Nose: Congestion present.     Mouth/Throat:     Mouth: Mucous membranes are moist.     Pharynx: Oropharynx is clear. Uvula midline. No oropharyngeal exudate or posterior oropharyngeal erythema.     Tonsils: No tonsillar exudate or tonsillar abscesses.  Eyes:     Conjunctiva/sclera: Conjunctivae normal.     Pupils: Pupils are equal, round, and reactive to light.  Cardiovascular:     Rate and Rhythm: Regular rhythm. Tachycardia present.     Heart sounds: Normal heart sounds.  Pulmonary:     Effort: Pulmonary effort is normal.      Breath sounds: Normal breath sounds. No wheezing or rhonchi.  Musculoskeletal:     Cervical back: Normal range of motion and neck supple.  Lymphadenopathy:     Cervical: No cervical adenopathy.  Skin:    General: Skin is warm and dry.  Neurological:     General: No focal deficit present.     Mental Status: She is alert and oriented to person, place, and time.  Psychiatric:        Mood and Affect: Mood normal.        Behavior: Behavior normal.      UC Treatments / Results  Labs (all labs ordered are listed, but only abnormal results are displayed) Labs Reviewed - No data to display  EKG   Radiology No results found.  Procedures Procedures (including critical care time)  Medications Ordered in UC Medications - No data to display  Initial Impression / Assessment and Plan / UC Course  I have reviewed the triage vital signs and the nursing notes.  Pertinent labs & imaging results that were  available during my care of the patient were reviewed by me and considered in my medical decision making (see chart for details).  Clinical Course as of 04/11/24 1110  Wed Apr 11, 2024  1110 Heart rate rechecked 92 [JM]    Clinical Course User Index [JM] Loreda Myla SAUNDERS, NP    Reviewed exam and symptoms with patient.  No red flags.  Wet read of chest x-ray without obvious consolidation, will contact for any positive results based on radiology overread once available.  Will treat for sinusitis/bronchitis with Augmentin  and Tessalon .  Advise rest fluids and PCP follow-up if symptoms do not improve.  ER precautions reviewed. Final Clinical Impressions(s) / UC Diagnoses   Final diagnoses:  Acute cough  Sinobronchitis     Discharge Instructions      Start Augmentin  twice daily for 7 days.  May take Tessalon  3 times a day as needed for your cough.  Lots of rest and fluids.  Please follow-up with your PCP if your symptoms do not improve.  Please go to the ER if you develop any worsening  symptoms.  Hope you feel better soon!     ED Prescriptions     Medication Sig Dispense Auth. Provider   amoxicillin -clavulanate (AUGMENTIN ) 875-125 MG tablet Take 1 tablet by mouth every 12 (twelve) hours. 14 tablet Evalena Fujii, Jodi R, NP   benzonatate  (TESSALON ) 100 MG capsule Take 1 capsule (100 mg total) by mouth every 8 (eight) hours. 21 capsule Nastashia Gallo, Jodi R, NP      PDMP not reviewed this encounter.   Loreda Myla SAUNDERS, NP 04/11/24 1110
